# Patient Record
Sex: Female | Born: 1995 | Race: Black or African American | Hispanic: No | Marital: Single | Smoking: Never smoker
Health system: Southern US, Community
[De-identification: ages and names within clinical notes are randomized; demographics above are authoritative.]

## PROBLEM LIST (undated history)

## (undated) DIAGNOSIS — B20 Human immunodeficiency virus [HIV] disease: Secondary | ICD-10-CM

## (undated) DIAGNOSIS — A159 Respiratory tuberculosis unspecified: Secondary | ICD-10-CM

## (undated) DIAGNOSIS — Z21 Asymptomatic human immunodeficiency virus [HIV] infection status: Secondary | ICD-10-CM

## (undated) HISTORY — DX: Respiratory tuberculosis unspecified: A15.9

---
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
## 2015-02-04 ENCOUNTER — Ambulatory Visit (INDEPENDENT_AMBULATORY_CARE_PROVIDER_SITE_OTHER): Payer: Commercial Managed Care - HMO | Admitting: Internal Medicine

## 2015-02-04 ENCOUNTER — Encounter: Payer: Self-pay | Admitting: Internal Medicine

## 2015-02-04 VITALS — BP 106/66 | HR 74 | Temp 97.1°F | Wt 119.0 lb

## 2015-02-04 DIAGNOSIS — Z206 Contact with and (suspected) exposure to human immunodeficiency virus [HIV]: Secondary | ICD-10-CM

## 2015-02-04 DIAGNOSIS — Z23 Encounter for immunization: Secondary | ICD-10-CM | POA: Diagnosis not present

## 2015-02-04 DIAGNOSIS — B20 Human immunodeficiency virus [HIV] disease: Secondary | ICD-10-CM | POA: Diagnosis not present

## 2015-02-04 LAB — COMPLETE METABOLIC PANEL WITH GFR
ALT: 17 U/L (ref 5–32)
AST: 18 U/L (ref 12–32)
Albumin: 4.2 g/dL (ref 3.6–5.1)
Alkaline Phosphatase: 105 U/L (ref 47–176)
BUN: 14 mg/dL (ref 7–20)
CALCIUM: 9.3 mg/dL (ref 8.9–10.4)
CHLORIDE: 107 mmol/L (ref 98–110)
CO2: 22 mmol/L (ref 20–31)
Creat: 0.51 mg/dL (ref 0.50–1.00)
GFR, Est African American: >89 mL/min (ref >=60)
Glucose, Bld: 77 mg/dL (ref 65–99)
POTASSIUM: 3.9 mmol/L (ref 3.8–5.1)
Sodium: 136 mmol/L (ref 135–146)
Total Bilirubin: 0.8 mg/dL (ref 0.2–1.1)
Total Protein: 7.3 g/dL (ref 6.3–8.2)

## 2015-02-04 LAB — LIPID PANEL
CHOLESTEROL: 203 mg/dL — AB (ref 125–170)
HDL: 64 mg/dL (ref 36–76)
LDL Cholesterol: 125 mg/dL — ABNORMAL HIGH (ref <110)
Total CHOL/HDL Ratio: 3.2 Ratio (ref <=5.0)
Triglycerides: 71 mg/dL (ref 40–136)
VLDL: 14 mg/dL (ref <30)

## 2015-02-04 LAB — CBC WITH DIFFERENTIAL/PLATELET
Basophils Absolute: 0 10*3/uL (ref 0.0–0.1)
Basophils Relative: 0 % (ref 0–1)
EOS PCT: 1 % (ref 0–5)
Eosinophils Absolute: 0.1 10*3/uL (ref 0.0–0.7)
HEMATOCRIT: 38.5 % (ref 36.0–46.0)
Hemoglobin: 13 g/dL (ref 12.0–15.0)
LYMPHS ABS: 2.1 10*3/uL (ref 0.7–4.0)
LYMPHS PCT: 40 % (ref 12–46)
MCH: 30.6 pg (ref 26.0–34.0)
MCHC: 33.8 g/dL (ref 30.0–36.0)
MCV: 90.6 fL (ref 78.0–100.0)
MONO ABS: 0.4 10*3/uL (ref 0.1–1.0)
MONOS PCT: 8 % (ref 3–12)
MPV: 9.1 fL (ref 8.6–12.4)
Neutro Abs: 2.7 10*3/uL (ref 1.7–7.7)
Neutrophils Relative %: 51 % (ref 43–77)
Platelets: 246 10*3/uL (ref 150–400)
RBC: 4.25 MIL/uL (ref 3.87–5.11)
RDW: 13.7 % (ref 11.5–15.5)
WBC: 5.2 10*3/uL (ref 4.0–10.5)

## 2015-02-04 MED ORDER — EMTRICITABINE-TENOFOVIR DF 200-300 MG PO TABS: 1.0000 | ORAL_TABLET | Freq: Every day | ORAL | Status: DC

## 2015-02-04 MED ORDER — DOLUTEGRAVIR SODIUM 50 MG PO TABS: 50.0000 mg | ORAL_TABLET | Freq: Every day | ORAL | Status: DC

## 2015-02-04 NOTE — Progress Notes (Signed)
Patient ID: Anye Brose, female   DOB: 1996/03/27, 19 y.o.   MRN: [id number] HPI: Neelie Welshans is a 19 y.o. female who is here for her initial visit for her HIV.   Allergies: No Known Allergies  Vitals: Temp: 97.1 F (36.2 C) (10/06 0858) Temp Source: Oral (10/06 0858) BP: 106/66 mmHg (10/06 0858) Pulse Rate: 74 (10/06 0858)  Past Medical History: Past Medical History  Diagnosis Date  . Tuberculosis     Social History: Social History   Social History  . Marital Status: Single    Spouse Name: N/A  . Number of Children: N/A  . Years of Education: N/A   Social History Main Topics  . Smoking status: Never Smoker   . Smokeless tobacco: Never Used  . Alcohol Use: No  . Drug Use: None  . Sexual Activity: No   Other Topics Concern  . None   Social History Narrative    Previous Regimen: Kaletra + Combivir?  Current Regimen: KLT + TRV  Labs: No results found for: HIV1RNAQUANT, HIV1RNAVL, CD4TABS, HEPBSAB, HEPBSAG, HCVAB  CrCl: CrCl cannot be calculated (Unknown ideal weight.).  Lipids: No results found for: CHOL, TRIG, HDL, CHOLHDL, VLDL, LDLCALC  Assessment: She was a perinatally infected immigrant from [place]. She was managed by [facility] and has been on KLT/TRV. They did discuss some option of changing the regimen to single daily regimen of Stribild/Odefsey/Triumeq/Prez+Truv. We discussed the options with her and the adopted mom today. The mom stated that she doesn't eat a consistent meal daily. Therefore, several of the regimen is probably not the best options. Explained DTG + TRU to her since this would have much less side effects and better tolerability. Both the mom and pt agreed that this will be a better option since it can be taken without the meals restriction. This is the plan we are going to go with.   Recommendations:  DC KLT Start DTG  PO qday Cont Truvada 1 PO qday  Clide Cliff, PharmD Clinical Infectious Disease  Pharmacist [facility] for Infectious Disease 02/04/2015, 2:45 PM

## 2015-02-05 LAB — T-HELPER CELL (CD4) - (RCID CLINIC ONLY)
CD4 T CELL HELPER: 30 % — AB (ref 33–55)
CD4 T Cell Abs: 590 /uL (ref 400–2700)

## 2015-02-05 LAB — HIV-1 RNA QUANT-NO REFLEX-BLD

## 2015-02-11 LAB — HLA B*5701: HLA-B*5701 w/rflx HLA-B High: NEGATIVE

## 2015-02-26 ENCOUNTER — Other Ambulatory Visit: Payer: Self-pay | Admitting: *Deleted

## 2015-02-26 DIAGNOSIS — B20 Human immunodeficiency virus [HIV] disease: Secondary | ICD-10-CM

## 2015-02-26 DIAGNOSIS — Z206 Contact with and (suspected) exposure to human immunodeficiency virus [HIV]: Secondary | ICD-10-CM

## 2015-02-26 MED ORDER — EMTRICITABINE-TENOFOVIR DF 200-300 MG PO TABS: 1.0000 | ORAL_TABLET | Freq: Every day | ORAL | Status: DC

## 2015-02-26 MED ORDER — DOLUTEGRAVIR SODIUM 50 MG PO TABS: 50.0000 mg | ORAL_TABLET | Freq: Every day | ORAL | Status: DC

## 2015-03-02 ENCOUNTER — Other Ambulatory Visit: Payer: Self-pay | Admitting: *Deleted

## 2015-03-02 ENCOUNTER — Telehealth: Payer: Self-pay | Admitting: *Deleted

## 2015-03-02 DIAGNOSIS — B20 Human immunodeficiency virus [HIV] disease: Secondary | ICD-10-CM

## 2015-03-02 DIAGNOSIS — Z206 Contact with and (suspected) exposure to human immunodeficiency virus [HIV]: Secondary | ICD-10-CM

## 2015-03-02 MED ORDER — EMTRICITABINE-TENOFOVIR DF 200-300 MG PO TABS: 1.0000 | ORAL_TABLET | Freq: Every day | ORAL | Status: DC

## 2015-03-02 MED ORDER — DOLUTEGRAVIR SODIUM 50 MG PO TABS: 50.0000 mg | ORAL_TABLET | Freq: Every day | ORAL | Status: DC

## 2015-03-02 NOTE — Telephone Encounter (Signed)
Notified via fax from [employer] of new pharmacy requirement.  Sent prescriptions electronically to [facility] to update patient's file.  Patient has seen Ulyses SouthwardMinh Pham, pharmacist.  She is scheduled to follow up with labs on 11/15 and Dr. Drue SecondSnider on 12/6 for her first [facility] visit. Andree CossHowell, Wretha Laris M, RN

## 2015-03-08 NOTE — Progress Notes (Signed)
Patient ID: Katherine [name], female   DOB: [birth year], 19 y.o.   MRN: [id number]       Patient ID: Katherine [name], female   DOB: [birth year], 19 y.o.   MRN: [id number]  HPI 19yo F with perinatal HIV disease, from [place]. Adopted. Previously cared at [facility] in [place]. Mother reports that her records reveal she has been undergood virologic control at time of adoption and now has been cared for at [facility], where she has been on truvada/kaletra. She is a Holiday representative at [employer], has some development delay.  Soc hx: works part time along with going to school. Not sexually active  Outpatient Encounter Prescriptions as of 02/04/2015  Medication Sig  . [DISCONTINUED] emtricitabine-tenofovir (TRUVADA) 200-300 MG per tablet Take 1 tablet by mouth daily.  . [DISCONTINUED] emtricitabine-tenofovir (TRUVADA) 200-300 MG tablet Take 1 tablet by mouth daily.  . [DISCONTINUED] lopinavir-ritonavir (KALETRA) 200-50 MG per tablet Take 2 tablets by mouth 2 (two) times daily.  . [DISCONTINUED] dolutegravir (TIVICAY) 50 MG tablet Take 1 tablet (50 mg total) by mouth daily.   No facility-administered encounter medications on file as of 02/04/2015.     Patient Active Problem List   Diagnosis Date Noted  . 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 H/O myopia 12/02/2014  . Developmental delay 12/02/2014  . Perinatal HIV exposure 12/02/2014     Health Maintenance Due  Topic Date Due  . TETANUS/TDAP  03/02/2015     Review of Systems  Constitutional: Negative for fever, chills, diaphoresis, activity change, appetite change, fatigue and unexpected weight change.  HENT: Negative for congestion, sore throat, rhinorrhea, sneezing, trouble swallowing and sinus pressure.  Eyes: Negative for photophobia and visual disturbance.  Respiratory: Negative for cough, chest tightness, shortness of breath, wheezing and stridor.  Cardiovascular: Negative for chest pain, palpitations and leg swelling.  Gastrointestinal:  Negative for nausea, vomiting, abdominal pain, diarrhea, constipation, blood in stool, abdominal distention and anal bleeding.  Genitourinary: Negative for dysuria, hematuria, flank pain and difficulty urinating.  Musculoskeletal: Negative for myalgias, back pain, joint swelling, arthralgias and gait problem.  Skin: Negative for color change, pallor, rash and wound.  Neurological: Negative for dizziness, tremors, weakness and light-headedness.  Hematological: Negative for adenopathy. Does not bruise/bleed easily.  Psychiatric/Behavioral: Negative for behavioral problems, confusion, sleep disturbance, dysphoric mood, decreased concentration and agitation.    Physical Exam   BP 106/66 mmHg  Pulse 74  Temp(Src) 97.1 F (36.2 C) (Oral)  Wt 119 lb (53.978 kg)  LMP 01/18/2015 (Approximate) Physical Exam  Constitutional:  oriented to person, place, and time. appears well-developed and well-nourished. No distress.  HENT: [place]/AT, PERRLA, no scleral icterus Mouth/Throat: Oropharynx is clear and moist. No oropharyngeal exudate.  Cardiovascular: Normal rate, regular rhythm and normal heart sounds. Exam reveals no gallop and no friction rub.  No murmur heard.  Pulmonary/Chest: Effort normal and breath sounds normal. No respiratory distress.  has no wheezes.  Neck = supple, no nuchal rigidity Abdominal: Soft. Bowel sounds are normal.  exhibits no distension. There is no tenderness.  Lymphadenopathy: no cervical adenopathy. No axillary adenopathy Neurological: alert and oriented to person, place, and time.  Skin: Skin is warm and dry. No rash noted. No erythema.  Psychiatric: a normal mood and affect.  behavior is normal.   Lab Results  Component Value Date   CD4TCELL 30* 02/04/2015   Lab Results  Component Value Date   CD4TABS 590 02/04/2015   Lab Results  Component Value Date   HIV1RNAQUANT <20 02/04/2015  No results found for: HEPBSAB No results found for: RPR  CBC Lab Results    Component Value Date   WBC 5.2 02/04/2015   RBC 4.25 02/04/2015   HGB 13.0 02/04/2015   HCT 38.5 02/04/2015   PLT 246 02/04/2015   MCV 90.6 02/04/2015   MCH 30.6 02/04/2015   MCHC 33.8 02/04/2015   RDW 13.7 02/04/2015   LYMPHSABS 2.1 02/04/2015   MONOABS 0.4 02/04/2015   EOSABS 0.1 02/04/2015   BASOSABS 0.0 02/04/2015   BMET Lab Results  Component Value Date   NA 136 02/04/2015   K 3.9 02/04/2015   CL 107 02/04/2015   CO2 22 02/04/2015   GLUCOSE 77 02/04/2015   BUN 14 02/04/2015   CREATININE 0.51 02/04/2015   CALCIUM 9.3 02/04/2015   GFRNONAA >89 02/04/2015   GFRAA >89 02/04/2015     Assessment and Plan  hiv disease = will switch to tivicay/truvada. Discussed adherence counseling  Health maintenance = will give flu shot

## 2015-03-16 ENCOUNTER — Other Ambulatory Visit: Payer: Commercial Managed Care - HMO

## 2015-03-16 DIAGNOSIS — B20 Human immunodeficiency virus [HIV] disease: Secondary | ICD-10-CM

## 2015-03-17 LAB — T-HELPER CELL (CD4) - (RCID CLINIC ONLY)
CD4 T CELL ABS: 520 /uL (ref 400–2700)
CD4 T CELL HELPER: 33 % (ref 33–55)

## 2015-03-18 LAB — HIV-1 RNA QUANT-NO REFLEX-BLD
HIV 1 RNA Quant: <20 copies/mL (ref <20)
HIV-1 RNA Quant, Log: <1.3 Log copies/mL (ref <1.30)

## 2015-04-06 ENCOUNTER — Ambulatory Visit (INDEPENDENT_AMBULATORY_CARE_PROVIDER_SITE_OTHER): Payer: Commercial Managed Care - HMO | Admitting: Internal Medicine

## 2015-04-06 ENCOUNTER — Encounter: Payer: Self-pay | Admitting: Internal Medicine

## 2015-04-06 VITALS — BP 108/71 | HR 77 | Temp 98.6°F | Wt 122.0 lb

## 2015-04-06 DIAGNOSIS — N926 Irregular menstruation, unspecified: Secondary | ICD-10-CM

## 2015-04-06 DIAGNOSIS — B20 Human immunodeficiency virus [HIV] disease: Secondary | ICD-10-CM | POA: Diagnosis not present

## 2015-04-06 LAB — POCT URINE PREGNANCY: PREG TEST UR: NEGATIVE

## 2015-04-06 MED ORDER — EMTRICITABINE-TENOFOVIR AF 200-25 MG PO TABS: 1.0000 | ORAL_TABLET | Freq: Every day | ORAL | Status: DC

## 2015-04-06 NOTE — Progress Notes (Signed)
Patient ID: Katherine [name], female   DOB: [birth year], 19 y.o.   MRN: [id number]       Patient ID: Katherine [name], female   DOB: [birth year], 19 y.o.   MRN: [id number]  HPI Katherine [name] is a 19yo F, with perinatal HIV disease, hx of pulm tb, CD 4 count of 520/VL<20. She has learning disabilities. Currently a senior at [employer]. Working 2 jobs one at [employer] and at [employer].At last visit, we changed her to tivicay/truvada to see if she can tolerate the regimen. She may have irregular menstrual cycle. She is not sexually active yet. Not in a relationship. She reports her last menses in mid oct   Pmhx: She was adopted from [place] while parents worked in [place] doing missionary work? She was transferred for peds ID clinic at [facility].    Outpatient Encounter Prescriptions as of 04/06/2015  Medication Sig  . dolutegravir (TIVICAY) 50 MG tablet Take 1 tablet (50 mg total) by mouth daily.  Marland Kitchen emtricitabine-tenofovir (TRUVADA) 200-300 MG tablet Take 1 tablet by mouth daily.   No facility-administered encounter medications on file as of 04/06/2015.     Patient Active Problem List   Diagnosis Date Noted  . 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 H/O myopia 12/02/2014  . Developmental delay 12/02/2014  . Perinatal HIV exposure 12/02/2014     Health Maintenance Due  Topic Date Due  . TETANUS/TDAP  03/02/2015     Review of Systems Review of Systems  Constitutional: Negative for fever, chills, diaphoresis, activity change, appetite change, fatigue and unexpected weight change.  HENT: Negative for congestion, sore throat, rhinorrhea, sneezing, trouble swallowing and sinus pressure.  Eyes: Negative for photophobia and visual disturbance.  Respiratory: Negative for cough, chest tightness, shortness of breath, wheezing and stridor.  Cardiovascular: Negative for chest pain, palpitations and leg swelling.  Gastrointestinal: Negative for nausea, vomiting, abdominal pain, diarrhea,  constipation, blood in stool, abdominal distention and anal bleeding.  Genitourinary: Negative for dysuria, hematuria, flank pain and difficulty urinating.  Musculoskeletal: Negative for myalgias, back pain, joint swelling, arthralgias and gait problem.  Skin: Negative for color change, pallor, rash and wound.  Neurological: Negative for dizziness, tremors, weakness and light-headedness.  Hematological: Negative for adenopathy. Does not bruise/bleed easily.  Psychiatric/Behavioral: Negative for behavioral problems, confusion, sleep disturbance, dysphoric mood, decreased concentration and agitation.   Physical Exam   BP 108/71 mmHg  Pulse 77  Temp(Src) 98.6 F (37 C) (Oral)  Wt 122 lb (55.339 kg)  LMP 02/01/2015 Physical Exam  Constitutional:  oriented to person, place, and time. appears well-developed and well-nourished. No distress.  HENT: [place]/AT, PERRLA, no scleral icterus Mouth/Throat: Oropharynx is clear and moist. No oropharyngeal exudate. Wearing braces Cardiovascular: Normal rate, regular rhythm and normal heart sounds. Exam reveals no gallop and no friction rub.  No murmur heard.  Pulmonary/Chest: Effort normal and breath sounds normal. No respiratory distress.  has no wheezes.  Neck = supple, no nuchal rigidity Lymphadenopathy: no cervical adenopathy. No axillary adenopathy Neurological: alert and oriented to person, place, and time.  Skin: Skin is warm and dry. No rash noted. No erythema.  Psychiatric: a normal mood and affect.  behavior is normal.   Lab Results  Component Value Date   CD4TCELL 33 03/16/2015   Lab Results  Component Value Date   CD4TABS 520 03/16/2015   CD4TABS 590 02/04/2015   Lab Results  Component Value Date   HIV1RNAQUANT <20 03/16/2015   No results found for: HEPBSAB No results found  for: RPR  CBC Lab Results  Component Value Date   WBC 5.2 02/04/2015   RBC 4.25 02/04/2015   HGB 13.0 02/04/2015   HCT 38.5 02/04/2015   PLT 246  02/04/2015   MCV 90.6 02/04/2015   MCH 30.6 02/04/2015   MCHC 33.8 02/04/2015   RDW 13.7 02/04/2015   LYMPHSABS 2.1 02/04/2015   MONOABS 0.4 02/04/2015   EOSABS 0.1 02/04/2015   BASOSABS 0.0 02/04/2015   BMET Lab Results  Component Value Date   NA 136 02/04/2015   K 3.9 02/04/2015   CL 107 02/04/2015   CO2 22 02/04/2015   GLUCOSE 77 02/04/2015   BUN 14 02/04/2015   CREATININE 0.51 02/04/2015   CALCIUM 9.3 02/04/2015   GFRNONAA >89 02/04/2015   GFRAA >89 02/04/2015     Assessment and Plan  hiv disease = Will check viral load to see that she continues to be virally suppressed on new regimen. Will continue with tivicay nad change truvada to descovy (TAF based regimen). Gave discount card  Irregular menses = we will check Urine pregnancy test to ensure it is negative. Will have her track her menstrual cycle on a calendar to see that she is fairly regular. If not, may need further work up or use of OBP to synchronize her menstrual cycle  Hiv/sti prevention = counseled her briefly on importance of using condoms when she decides to become sexually active.   rtc in 3 months

## 2015-04-07 LAB — T-HELPER CELL (CD4) - (RCID CLINIC ONLY)
CD4 T CELL ABS: 620 /uL (ref 400–2700)
CD4 T CELL HELPER: 31 % — AB (ref 33–55)

## 2015-04-07 LAB — HIV-1 RNA QUANT-NO REFLEX-BLD
HIV 1 RNA Quant: <20 copies/mL (ref <20)
HIV-1 RNA Quant, Log: <1.3 Log copies/mL (ref <1.30)

## 2015-05-06 ENCOUNTER — Other Ambulatory Visit: Payer: Self-pay | Admitting: *Deleted

## 2015-05-06 DIAGNOSIS — B20 Human immunodeficiency virus [HIV] disease: Secondary | ICD-10-CM

## 2015-05-06 MED ORDER — EMTRICITABINE-TENOFOVIR AF 200-25 MG PO TABS: 1.0000 | ORAL_TABLET | Freq: Every day | ORAL | Status: DC

## 2015-05-06 MED ORDER — DOLUTEGRAVIR SODIUM 50 MG PO TABS
50.0000 mg | ORAL_TABLET | Freq: Every day | ORAL | Status: DC
Start: 2015-05-06 — End: 2015-06-17

## 2015-05-06 NOTE — Telephone Encounter (Signed)
Needing local refill, usually uses mail order.

## 2015-06-17 ENCOUNTER — Other Ambulatory Visit: Payer: Self-pay | Admitting: Internal Medicine

## 2015-06-17 DIAGNOSIS — B20 Human immunodeficiency virus [HIV] disease: Secondary | ICD-10-CM

## 2015-06-17 MED ORDER — EMTRICITABINE-TENOFOVIR AF 200-25 MG PO TABS: 1.0000 | ORAL_TABLET | Freq: Every day | ORAL | Status: DC

## 2015-07-06 ENCOUNTER — Ambulatory Visit (INDEPENDENT_AMBULATORY_CARE_PROVIDER_SITE_OTHER): Payer: Commercial Managed Care - PPO | Admitting: Internal Medicine

## 2015-07-06 ENCOUNTER — Encounter: Payer: Self-pay | Admitting: Internal Medicine

## 2015-07-06 VITALS — BP 112/68 | HR 80 | Temp 98.4°F | Wt 125.0 lb

## 2015-07-06 DIAGNOSIS — Z206 Contact with and (suspected) exposure to human immunodeficiency virus [HIV]: Secondary | ICD-10-CM | POA: Diagnosis not present

## 2015-07-06 DIAGNOSIS — B20 Human immunodeficiency virus [HIV] disease: Secondary | ICD-10-CM

## 2015-07-06 NOTE — Progress Notes (Signed)
Patient ID: Katherine [name], female   DOB: 1995-06-01, 20 y.o.   MRN: [id number]       Patient ID: Katherine [name], female   DOB: 10-20-1995, 20 y.o.   MRN: [id number]  HPI  19yo F ugandan who has perinatal HIV disease, adopted by  family, CD 4 count of 620/VL<20 (dec 2016)on tivicay and descovy. Doing well with adherence. No complaints with taking medicines. Denies missing doses. She is doing well in her final year of high school. She has 2 part-time jobs.  She follows with orthodontist for her corrective braces.   Soc hx: she denies being sexually active.   Outpatient Encounter Prescriptions as of 07/06/2015  Medication Sig  . emtricitabine-tenofovir AF (DESCOVY) 200-25 MG tablet Take 1 tablet by mouth daily.  Marland Kitchen TIVICAY 50 MG tablet TAKE 1 TABLET (50 MG TOTAL) BY MOUTH DAILY.   No facility-administered encounter medications on file as of 07/06/2015.     Patient Active Problem List   Diagnosis Date Noted  . 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 H/O myopia 12/02/2014  . Developmental delay 12/02/2014  . Perinatal HIV exposure 12/02/2014     Health Maintenance Due  Topic Date Due  . TETANUS/TDAP  03/02/2015     Review of Systems  Constitutional: Negative for fever, chills, diaphoresis, activity change, appetite change, fatigue and unexpected weight change.  HENT: Negative for congestion, sore throat, rhinorrhea, sneezing, trouble swallowing and sinus pressure.  Eyes: Negative for photophobia and visual disturbance.  Respiratory: Negative for cough, chest tightness, shortness of breath, wheezing and stridor.  Cardiovascular: Negative for chest pain, palpitations and leg swelling.  Gastrointestinal: Negative for nausea, vomiting, abdominal pain, diarrhea, constipation, blood in stool, abdominal distention and anal bleeding.  Genitourinary: Negative for dysuria, hematuria, flank pain and difficulty urinating.  Musculoskeletal: Negative for myalgias, back pain, joint swelling,  arthralgias and gait problem.  Skin: Negative for color change, pallor, rash and wound.  Neurological: Negative for dizziness, tremors, weakness and light-headedness.  Hematological: Negative for adenopathy. Does not bruise/bleed easily.  Psychiatric/Behavioral: Negative for behavioral problems, confusion, sleep disturbance, dysphoric mood, decreased concentration and agitation.    Physical Exam   BP 112/68 mmHg  Pulse 80  Temp(Src) 98.4 F (36.9 C) (Oral)  Wt 125 lb (56.7 kg)  LMP 06/17/2015 (Exact Date) Physical Exam  Constitutional:  oriented to person, place, and time. appears well-developed and well-nourished. No distress.  HENT: /AT, PERRLA, no scleral icterus Mouth/Throat: Oropharynx is clear and moist. No oropharyngeal exudate.  Cardiovascular: Normal rate, regular rhythm and normal heart sounds. Exam reveals no gallop and no friction rub.  No murmur heard.  Pulmonary/Chest: Effort normal and breath sounds normal. No respiratory distress.  has no wheezes.  Neck = supple, no nuchal rigidity Abdominal: Soft. Bowel sounds are normal.  exhibits no distension. There is no tenderness.  Lymphadenopathy: no cervical adenopathy. No axillary adenopathy Neurological: alert and oriented to person, place, and time.  Skin: Skin is warm and dry. No rash noted. No erythema.  Psychiatric: a normal mood and affect.  behavior is normal.   Lab Results  Component Value Date   CD4TCELL 31* 04/06/2015   Lab Results  Component Value Date   CD4TABS 620 04/06/2015   CD4TABS 520 03/16/2015   CD4TABS 590 02/04/2015   Lab Results  Component Value Date   HIV1RNAQUANT <20 04/06/2015   No results found for: HEPBSAB No results found for: RPR  CBC Lab Results  Component Value Date   WBC 5.2 02/04/2015  RBC 4.25 02/04/2015   HGB 13.0 02/04/2015   HCT 38.5 02/04/2015   PLT 246 02/04/2015   MCV 90.6 02/04/2015   MCH 30.6 02/04/2015   MCHC 33.8 02/04/2015   RDW 13.7 02/04/2015    LYMPHSABS 2.1 02/04/2015   MONOABS 0.4 02/04/2015   EOSABS 0.1 02/04/2015   BASOSABS 0.0 02/04/2015   BMET Lab Results  Component Value Date   NA 136 02/04/2015   K 3.9 02/04/2015   CL 107 02/04/2015   CO2 22 02/04/2015   GLUCOSE 77 02/04/2015   BUN 14 02/04/2015   CREATININE 0.51 02/04/2015   CALCIUM 9.3 02/04/2015   GFRNONAA >89 02/04/2015   GFRAA >89 02/04/2015     Assessment and Plan  hiv disease =well controlled. Will check her labs in June (6 month labs) continue on tivicay and descovy  Menses = regular. Currently no need to refer to ob/gyn. She is not sexually active presently. We have discussed importance of using condoms and discussed birth control pills to prevent unplanned pregnancy. Consider doing condom demonstrations at next visit  Health maintenance = uptodate on all vaccines. Will double check that she has had HPV vaccination

## 2015-09-23 ENCOUNTER — Other Ambulatory Visit: Payer: Commercial Managed Care - HMO

## 2015-10-07 ENCOUNTER — Encounter: Payer: Self-pay | Admitting: Internal Medicine

## 2015-10-07 ENCOUNTER — Ambulatory Visit (INDEPENDENT_AMBULATORY_CARE_PROVIDER_SITE_OTHER): Payer: Commercial Managed Care - PPO | Admitting: Internal Medicine

## 2015-10-07 VITALS — BP 101/65 | HR 71 | Temp 98.1°F | Ht 62.0 in | Wt 130.2 lb

## 2015-10-07 DIAGNOSIS — Z23 Encounter for immunization: Secondary | ICD-10-CM

## 2015-10-07 DIAGNOSIS — B20 Human immunodeficiency virus [HIV] disease: Secondary | ICD-10-CM | POA: Diagnosis not present

## 2015-10-07 NOTE — Progress Notes (Signed)
Patient ID: Katherine [name], female   DOB: 12/02/1995, 20 y.o.   MRN: [id number]       Patient ID: Katherine [name], female   DOB: 01/22/1996, 20 y.o.   MRN: [id number]  HPI  20yo F with perinatally acquired hiv disease, CD 4 count of 620/VL<20 on tivicay-descovy. Doing well with adherence. She has 2 part-time jobs presently .will be graduating from high school this week. She denies any difficulties with her health. Not sexually active. Outpatient Encounter Prescriptions as of 10/07/2015  Medication Sig  . emtricitabine-tenofovir AF (DESCOVY) 200-25 MG tablet Take 1 tablet by mouth daily.  Marland Kitchen. TIVICAY 50 MG tablet TAKE 1 TABLET (50 MG TOTAL) BY MOUTH DAILY.   No facility-administered encounter medications on file as of 10/07/2015.     Patient Active Problem List   Diagnosis Date Noted  . 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 H/O myopia 12/02/2014  . Developmental delay 12/02/2014  . Perinatal HIV exposure 12/02/2014     Health Maintenance Due  Topic Date Due  . TETANUS/TDAP  03/02/2015     Review of Systems   Constitutional: Negative for fever, chills, diaphoresis, activity change, appetite change, fatigue and unexpected weight change.  HENT: Negative for congestion, sore throat, rhinorrhea, sneezing, trouble swallowing and sinus pressure.  Eyes: Negative for photophobia and visual disturbance.  Respiratory: Negative for cough, chest tightness, shortness of breath, wheezing and stridor.  Cardiovascular: Negative for chest pain, palpitations and leg swelling.  Gastrointestinal: Negative for nausea, vomiting, abdominal pain, diarrhea, constipation, blood in stool, abdominal distention and anal bleeding.  Genitourinary: Negative for dysuria, hematuria, flank pain and difficulty urinating.  Musculoskeletal: Negative for myalgias, back pain, joint swelling, arthralgias and gait problem.  Skin: Negative for color change, pallor, rash and wound.  Neurological: Negative for dizziness,  tremors, weakness and light-headedness.  Hematological: Negative for adenopathy. Does not bruise/bleed easily.  Psychiatric/Behavioral: Negative for behavioral problems, confusion, sleep disturbance, dysphoric mood, decreased concentration and agitation.    Physical Exam   BP 101/65 mmHg  Pulse 71  Temp(Src) 98.1 F (36.7 C) (Oral)  Ht 5\' 2"  (1.575 m)  Wt 130 lb 4 oz (59.081 kg)  BMI 23.82 kg/m2  LMP 09/27/2015 (Exact Date) Physical Exam  Constitutional:  oriented to person, place, and time. appears well-developed and well-nourished. No distress.  HENT: [place]/AT, PERRLA, no scleral icterus Mouth/Throat: Oropharynx is clear and moist. No oropharyngeal exudate.  Cardiovascular: Normal rate, regular rhythm and normal heart sounds. Exam reveals no gallop and no friction rub.  No murmur heard.  Pulmonary/Chest: Effort normal and breath sounds normal. No respiratory distress.  has no wheezes.  Neck = supple, no nuchal rigidity Abdominal: Soft. Bowel sounds are normal.  exhibits no distension. There is no tenderness.  Lymphadenopathy: no cervical adenopathy. No axillary adenopathy Neurological: alert and oriented to person, place, and time.  Skin: Skin is warm and dry. No rash noted. No erythema.  Psychiatric: a normal mood and affect.  behavior is normal.   Lab Results  Component Value Date   CD4TCELL 31* 04/06/2015   Lab Results  Component Value Date   CD4TABS 620 04/06/2015   CD4TABS 520 03/16/2015   CD4TABS 590 02/04/2015   Lab Results  Component Value Date   HIV1RNAQUANT <20 04/06/2015   No results found for: HEPBSAB No results found for: RPR  CBC Lab Results  Component Value Date   WBC 5.2 02/04/2015   RBC 4.25 02/04/2015   HGB 13.0 02/04/2015   HCT 38.5 02/04/2015  PLT 246 02/04/2015   MCV 90.6 02/04/2015   MCH 30.6 02/04/2015   MCHC 33.8 02/04/2015   RDW 13.7 02/04/2015   LYMPHSABS 2.1 02/04/2015   MONOABS 0.4 02/04/2015   EOSABS 0.1 02/04/2015   BASOSABS  0.0 02/04/2015   BMET Lab Results  Component Value Date   NA 136 02/04/2015   K 3.9 02/04/2015   CL 107 02/04/2015   CO2 22 02/04/2015   GLUCOSE 77 02/04/2015   BUN 14 02/04/2015   CREATININE 0.51 02/04/2015   CALCIUM 9.3 02/04/2015   GFRNONAA >89 02/04/2015   GFRAA >89 02/04/2015     Assessment and Plan  hiv disease = will continue with current regimen. Will get 6 month labs today  rtc in Fall   imms = will give 3rd dose of HPV

## 2015-10-08 LAB — HIV-1 RNA QUANT-NO REFLEX-BLD
HIV 1 RNA Quant: <20 copies/mL (ref <20)
HIV-1 RNA Quant, Log: <1.3 Log copies/mL (ref <1.30)

## 2015-10-08 LAB — T-HELPER CELL (CD4) - (RCID CLINIC ONLY)
CD4 % Helper T Cell: 28 % — ABNORMAL LOW (ref 33–55)
CD4 T CELL ABS: 600 /uL (ref 400–2700)

## 2016-02-07 ENCOUNTER — Ambulatory Visit: Payer: Commercial Managed Care - PPO | Admitting: Internal Medicine

## 2016-03-30 ENCOUNTER — Other Ambulatory Visit: Payer: Self-pay | Admitting: Internal Medicine

## 2016-03-30 DIAGNOSIS — B20 Human immunodeficiency virus [HIV] disease: Secondary | ICD-10-CM

## 2016-04-07 ENCOUNTER — Other Ambulatory Visit: Payer: Self-pay | Admitting: Internal Medicine

## 2016-04-07 ENCOUNTER — Other Ambulatory Visit: Payer: Commercial Managed Care - PPO

## 2016-04-07 ENCOUNTER — Other Ambulatory Visit (HOSPITAL_COMMUNITY)
Admission: RE | Admit: 2016-04-07 | Discharge: 2016-04-07 | Disposition: A | Discharge status: Home / Self Care | Payer: Commercial Managed Care - PPO | Source: Ambulatory Visit | Attending: Internal Medicine | Admitting: Internal Medicine

## 2016-04-07 DIAGNOSIS — B20 Human immunodeficiency virus [HIV] disease: Secondary | ICD-10-CM

## 2016-04-07 DIAGNOSIS — Z113 Encounter for screening for infections with a predominantly sexual mode of transmission: Secondary | ICD-10-CM | POA: Diagnosis not present

## 2016-04-07 DIAGNOSIS — Z79899 Other long term (current) drug therapy: Secondary | ICD-10-CM

## 2016-04-07 LAB — COMPREHENSIVE METABOLIC PANEL
ALBUMIN: 4.1 g/dL (ref 3.6–5.1)
ALT: 28 U/L (ref 6–29)
AST: 27 U/L (ref 10–30)
Alkaline Phosphatase: 79 U/L (ref 33–115)
BUN: 9 mg/dL (ref 7–25)
CALCIUM: 9.1 mg/dL (ref 8.6–10.2)
CHLORIDE: 107 mmol/L (ref 98–110)
CO2: 22 mmol/L (ref 20–31)
CREATININE: 0.53 mg/dL (ref 0.50–1.10)
Glucose, Bld: 81 mg/dL (ref 65–99)
POTASSIUM: 3.7 mmol/L (ref 3.5–5.3)
Sodium: 138 mmol/L (ref 135–146)
TOTAL PROTEIN: 7.8 g/dL (ref 6.1–8.1)
Total Bilirubin: 0.5 mg/dL (ref 0.2–1.2)

## 2016-04-07 LAB — CBC
HEMATOCRIT: 39.7 % (ref 35.0–45.0)
HEMOGLOBIN: 12.6 g/dL (ref 11.7–15.5)
MCH: 28.4 pg (ref 27.0–33.0)
MCHC: 31.7 g/dL — ABNORMAL LOW (ref 32.0–36.0)
MCV: 89.6 fL (ref 80.0–100.0)
MPV: 9.5 fL (ref 7.5–12.5)
Platelets: 228 10*3/uL (ref 140–400)
RBC: 4.43 MIL/uL (ref 3.80–5.10)
RDW: 13.3 % (ref 11.0–15.0)
WBC: 5 10*3/uL (ref 3.8–10.8)

## 2016-04-07 LAB — LIPID PANEL
CHOLESTEROL: 179 mg/dL (ref <200)
HDL: 49 mg/dL — ABNORMAL LOW (ref >50)
LDL CALC: 111 mg/dL — AB (ref <100)
TRIGLYCERIDES: 93 mg/dL (ref <150)
Total CHOL/HDL Ratio: 3.7 Ratio (ref <5.0)
VLDL: 19 mg/dL (ref <30)

## 2016-04-07 LAB — T-HELPER CELL (CD4) - (RCID CLINIC ONLY)
CD4 T CELL ABS: 360 /uL — AB (ref 400–2700)
CD4 T CELL HELPER: 15 % — AB (ref 33–55)

## 2016-04-08 LAB — RPR

## 2016-04-10 LAB — URINE CYTOLOGY ANCILLARY ONLY
Chlamydia: NEGATIVE
Neisseria Gonorrhea: NEGATIVE

## 2016-04-12 LAB — HIV-1 RNA QUANT-NO REFLEX-BLD
HIV 1 RNA Quant: 1133 copies/mL — ABNORMAL HIGH (ref <20)
HIV-1 RNA QUANT, LOG: 3.05 {Log_copies}/mL — AB (ref <1.30)

## 2016-04-20 ENCOUNTER — Encounter: Payer: Self-pay | Admitting: Internal Medicine

## 2016-04-20 ENCOUNTER — Ambulatory Visit (INDEPENDENT_AMBULATORY_CARE_PROVIDER_SITE_OTHER): Payer: Commercial Managed Care - PPO | Admitting: Internal Medicine

## 2016-04-20 VITALS — BP 99/69 | HR 87 | Temp 98.1°F | Wt 140.0 lb

## 2016-04-20 DIAGNOSIS — B20 Human immunodeficiency virus [HIV] disease: Secondary | ICD-10-CM

## 2016-04-20 DIAGNOSIS — Z206 Contact with and (suspected) exposure to human immunodeficiency virus [HIV]: Secondary | ICD-10-CM | POA: Diagnosis not present

## 2016-04-20 DIAGNOSIS — Z9119 Patient's noncompliance with other medical treatment and regimen: Secondary | ICD-10-CM

## 2016-04-20 DIAGNOSIS — Z91199 Patient's noncompliance with other medical treatment and regimen due to unspecified reason: Secondary | ICD-10-CM

## 2016-04-20 NOTE — Patient Instructions (Signed)
Come by in the clinic in beginning of January for labs

## 2016-04-20 NOTE — Progress Notes (Signed)
Patient ID: Katherine [name], female   DOB: 08/20/1995, 20 y.o.   MRN: [id number]  HPI Katherine [name] is a 20yo F with perinatally acquired hiv disease, with cognitive delay, Cd 4 count of 360/VL 1133 in dec 2017, she reports stopping her ART during the summer, due to fatigue of taking meds long term. She is here with her mother. Katherine [name] appearance is much different than at her previous appointments, she has darkened eyebrows, eyeliner, black lipstick-somewhat gothic appearing. Since we last saw her, she had just graduated high school. She previously was working 2 jobs with plans to go to [employer]. Her mother confides that her daughter was fired from a job since she was caught stealing. Her mother also mentioned that she suspects that Katherine [name] is trying to fit in, be a regular 20 year old. Katherine [name] has made a few poor choices over the summer where trust issues have now developed between parents and Katherine [name]. One of Katherine [name]'s friends, lives fairly unintended with older brothers and friends, which Katherine [name]'s mom does not know well and is concern for Katherine [name]'s personal safety thus she is forbidden to stay at her friend's house without parental supervision.   The strain appears to be that though Katherine [name] is [age]yr she is cognitively much younger, often too trusting. Family is concern that her recent change in her appearance also makes her more like an adult. Concern for unwanted female attention/getting taken advantage of.   Katherine [name] states that she has a female friend but not sexually active  She started back on taking her medications, now daily for the last 2 wks. Her mother is dispensing meds for her again (previously she trusted Katherine [name] to take her own meds)  backstory - Katherine [name] adopted around age 146-9 yo [place]. Her parents had lived in [place] for a few years during adoption process. Her mother reports that back at the orphanage, Katherine [name] had seen older kids died of advanced hiv disease due to lack of access to meds, so she is quite  surprised that Katherine [name] had willingly stopped to taking her meds  Outpatient Encounter Prescriptions as of 04/20/2016  Medication Sig  . DESCOVY 200-25 MG tablet TAKE 1 TABLET BY MOUTH DAILY.  Marland Kitchen. TIVICAY 50 MG tablet TAKE 1 TABLET (50 MG TOTAL) BY MOUTH DAILY.   No facility-administered encounter medications on file as of 04/20/2016.      Patient Active Problem List   Diagnosis Date Noted  . 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 H/O myopia 12/02/2014  . Developmental delay 12/02/2014  . Perinatal HIV exposure 12/02/2014     Health Maintenance Due  Topic Date Due  . INFLUENZA VACCINE  11/30/2015     Review of Systems  Constitutional: Negative for fever, chills, diaphoresis, activity change, appetite change, fatigue and unexpected weight change.  HENT: Negative for congestion, sore throat, rhinorrhea, sneezing, trouble swallowing and sinus pressure.  Eyes: Negative for photophobia and visual disturbance.  Respiratory: Negative for cough, chest tightness, shortness of breath, wheezing and stridor.  Cardiovascular: Negative for chest pain, palpitations and leg swelling.  Gastrointestinal: Negative for nausea, vomiting, abdominal pain, diarrhea, constipation, blood in stool, abdominal distention and anal bleeding.  Genitourinary: Negative for dysuria, hematuria, flank pain and difficulty urinating.  Musculoskeletal: Negative for myalgias, back pain, joint swelling, arthralgias and gait problem.  Skin: Negative for color change, pallor, rash and wound.  Neurological: Negative for dizziness, tremors, weakness and light-headedness.  Hematological: Negative for adenopathy. Does not bruise/bleed easily.  Psychiatric/Behavioral: Negative for behavioral problems,  confusion, sleep disturbance, dysphoric mood, decreased concentration and agitation.   BP 99/69   Pulse 87   Temp 98.1 F (36.7 C) (Oral)   Wt 140 lb (63.5 kg)   LMP 04/06/2016   BMI 25.61 kg/m  Physical Exam  Constitutional:   oriented to person, place, and time. appears well-developed and well-nourished, with heavy make-up, which is new. No distress.  HENT: [place]/AT, PERRLA, no scleral icterus Mouth/Throat: Oropharynx is clear and moist. No oropharyngeal exudate.  Cardiovascular: Normal rate, regular rhythm and normal heart sounds. Exam reveals no gallop and no friction rub.  No murmur heard.  Pulmonary/Chest: Effort normal and breath sounds normal. No respiratory distress.  has no wheezes.  Neck = supple, no nuchal rigidity Abdominal: Soft. Bowel sounds are normal.  exhibits no distension. There is no tenderness.  Lymphadenopathy: no cervical adenopathy. No axillary adenopathy Neurological: alert and oriented to person, place, and time.  Skin: Skin is warm and dry. No rash noted. No erythema.  Psychiatric: a normal mood and affect.  behavior is normal. Occasionally defensive during conversation with her mother    Lab Results  Component Value Date   CD4TCELL 15 (L) 04/07/2016   Lab Results  Component Value Date   CD4TABS 360 (L) 04/07/2016   CD4TABS 600 10/07/2015   CD4TABS 620 04/06/2015   Lab Results  Component Value Date   HIV1RNAQUANT 1,133 (H) 04/07/2016   No results found for: HEPBSAB No results found for: RPR  CBC Lab Results  Component Value Date   WBC 5.0 04/07/2016   RBC 4.43 04/07/2016   HGB 12.6 04/07/2016   HCT 39.7 04/07/2016   PLT 228 04/07/2016   MCV 89.6 04/07/2016   MCH 28.4 04/07/2016   MCHC 31.7 (L) 04/07/2016   RDW 13.3 04/07/2016   LYMPHSABS 2.1 02/04/2015   MONOABS 0.4 02/04/2015   EOSABS 0.1 02/04/2015   BASOSABS 0.0 02/04/2015   BMET Lab Results  Component Value Date   NA 138 04/07/2016   K 3.7 04/07/2016   CL 107 04/07/2016   CO2 22 04/07/2016   GLUCOSE 81 04/07/2016   BUN 9 04/07/2016   CREATININE 0.53 04/07/2016   CALCIUM 9.1 04/07/2016   GFRNONAA >89 02/04/2015   GFRAA >89 02/04/2015     Assessment and Plan  hiv disease = recently poorly  controlled due adherence issues. We will Add genotype. She has restarted her old regimen. We will plan to have her come back in early January so that we can check her VL at 4-6 wk mark. May need to change regimen if not undetectable.  Have her come back in 6 wk but labs at beginning of the year  cassie will do adherence appt in early january  Have her meet with thp, to see if interested in being with case manager as well as possibly joining support group.  Spent 40 min with patient with greater than 50% in adherence counseling

## 2016-04-28 ENCOUNTER — Telehealth: Payer: Self-pay | Admitting: *Deleted

## 2016-04-28 NOTE — Telephone Encounter (Signed)
Regarding the viral load drawn 12/8 that was supposed to be sent for genotype, Emma sent the email requesting this lab on 12/21 - they did not reply until 12/27.  The specimen is still frozen and was sent for further analysis on 12/28. It is still able to be processed. Andree CossHowell, Danni Shima M

## 2016-05-03 ENCOUNTER — Telehealth: Payer: Self-pay | Admitting: Pharmacist Clinician (PhC)/ Clinical Pharmacy Specialist

## 2016-05-03 ENCOUNTER — Ambulatory Visit: Payer: Commercial Managed Care - PPO

## 2016-05-03 NOTE — Telephone Encounter (Signed)
Talked to Ambrie's mom today after she couldn't come to the appt this am due to starting a new "class". Rescheduled for tomorrow at 4.

## 2016-05-04 ENCOUNTER — Ambulatory Visit (INDEPENDENT_AMBULATORY_CARE_PROVIDER_SITE_OTHER): Payer: Commercial Managed Care - PPO | Admitting: Pharmacist Clinician (PhC)/ Clinical Pharmacy Specialist

## 2016-05-04 DIAGNOSIS — B20 Human immunodeficiency virus [HIV] disease: Secondary | ICD-10-CM

## 2016-05-04 NOTE — Progress Notes (Signed)
HPI: Fritzi Mandesatricia Mccollom is a 21 y.o. female who is here to visit with pharmacy to help with resistance and check her VL.   Allergies: No Known Allergies  Vitals:    Past Medical History: Past Medical History:  Diagnosis Date  . Tuberculosis     Social History: Social History   Social History  . Marital status: Single    Spouse name: N/A  . Number of children: N/A  . Years of education: N/A   Social History Main Topics  . Smoking status: Never Smoker  . Smokeless tobacco: Never Used  . Alcohol use No  . Drug use: Unknown  . Sexual activity: No   Other Topics Concern  . Not on file   Social History Narrative  . No narrative on file    Previous Regimen: KLT, TRV  Current Regimen: DTG/Descovy  Labs: HIV 1 RNA Quant (copies/mL)  Date Value  04/07/2016 1,133 (H)  10/07/2015 <20  04/06/2015 <20   CD4 T Cell Abs (/uL)  Date Value  04/07/2016 360 (L)  10/07/2015 600  04/06/2015 620    CrCl: CrCl cannot be calculated (Patient's most recent lab result is older than the maximum 21 days allowed.).  Lipids:    Component Value Date/Time   CHOL 179 04/07/2016 1038   TRIG 93 04/07/2016 1038   HDL 49 (L) 04/07/2016 1038   CHOLHDL 3.7 04/07/2016 1038   VLDL 19 04/07/2016 1038   LDLCALC 111 (H) 04/07/2016 1038    Assessment: Elease Hashimotoatricia is here with mom for a pharmacy visit so we can check on her adherence. She missed the appt yesterday because of the nails class issue. 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 Her mom has been helping her to stay on track. Since the lapse in taking her meds, she has not missed any doses since then. I checked with Clydie BraunKaren yesterday and the genotype is still pending. I'm hoping that she has not developed any resistance. Going to get a VL today to see how she is doing. I'm going to bring her back in a month to check her compliance.   Once again, I explained to her about the importance of taking meds when it comes to HIV and the risk  that she would face if her immune declines. I think she seems to be a bit more motivated now.   Recommendations:  Cont DTG/Descovy HIV VL today F/u with pharmacy in 1 month  Ulyses SouthwardMinh Pham, PharmD, BCPS, AAHIVP, CPP Clinical Infectious Disease Pharmacist [facility] 05/04/2016, 10:19 PM

## 2016-05-04 NOTE — Patient Instructions (Signed)
Viral load today

## 2016-05-08 LAB — HIV-1 RNA QUANT-NO REFLEX-BLD
HIV 1 RNA Quant: 39 copies/mL — ABNORMAL HIGH (ref <20)
HIV-1 RNA Quant, Log: 1.59 Log copies/mL — ABNORMAL HIGH (ref <1.30)

## 2016-05-10 LAB — HIV-1 GENOTYPR PLUS

## 2016-05-22 ENCOUNTER — Telehealth: Payer: Self-pay

## 2016-05-22 NOTE — Telephone Encounter (Signed)
Romualdo BolkAlbert Sanders from [facility] called inquiring about when and if Pt was being seen here and I told him yes. Inquired about where Pt had been seen prior and I informed him that [facility].

## 2016-05-23 ENCOUNTER — Telehealth: Payer: Self-pay | Admitting: Pharmacist Clinician (PhC)/ Clinical Pharmacy Specialist

## 2016-05-23 ENCOUNTER — Other Ambulatory Visit: Payer: Self-pay | Admitting: Pharmacist Clinician (PhC)/ Clinical Pharmacy Specialist

## 2016-05-23 DIAGNOSIS — B20 Human immunodeficiency virus [HIV] disease: Secondary | ICD-10-CM

## 2016-05-23 MED ORDER — DARUNAVIR-COBICISTAT 800-150 MG PO TABS: 1.0000 | ORAL_TABLET | Freq: Every day | ORAL | 11 refills | Status: DC

## 2016-05-23 MED ORDER — DOLUTEGRAVIR SODIUM 50 MG PO TABS: ORAL_TABLET | ORAL | 11 refills | Status: DC

## 2016-05-23 MED ORDER — EMTRICITABINE-TENOFOVIR AF 200-25 MG PO TABS: 1.0000 | ORAL_TABLET | Freq: Every day | ORAL | 11 refills | Status: DC

## 2016-05-23 NOTE — Telephone Encounter (Signed)
Unfortunately, her insurance will not allowed [facility] to do the fill so Katherine [name] will call the insurance to see if they will override it.

## 2016-05-23 NOTE — Telephone Encounter (Signed)
Left a VM for Katherine [name]'s mother to call back to we can change her ART due to resistance.   Addendum:  Spoke to the mom today and explained to her that we need to add a third agent to her regimen. [facility]. The mom was very appreciative. Bringing her back in 2 weeks to check her compliance.   HIV Genotype Composite Data Genotype Dates:   Mutations in Bold impact drug susceptibility RT Mutations M184V, L210W, T215Y, K219N, L100I, K103N  PI Mutations   Integrase Mutations    Interpretation of Genotype Data per Stanford HIV Database Nucleoside RTIs  abacavir (ABC) Intermediate Resistance zidovudine (AZT) High-Level Resistance emtricitabine (FTC) High-Level Resistance lamivudine (3TC) High-Level Resistance tenofovir (TDF) Low-Level Resistance   Non-Nucleoside RTIs  efavirenz (EFV) High-Level Resistance etravirine (ETR) Intermediate Resistance nevirapine (NVP) High-Level Resistance rilpivirine (RPV) High-Level Resistance   Protease Inhibitors     Integrase Inhibitors

## 2016-05-24 ENCOUNTER — Telehealth: Payer: Self-pay | Admitting: Pharmacist Clinician (PhC)/ Clinical Pharmacy Specialist

## 2016-05-24 MED FILL — TIVICAY 50 MG TABLET: 50 | 30 days supply | Qty: 30 | Fill #0

## 2016-05-24 MED FILL — DESCOVY 200-25 MG TABS: 200-25 | 30 days supply | Qty: 30 | Fill #0

## 2016-05-24 NOTE — Telephone Encounter (Signed)
Called Betsy back to see if she was able to get her insurance to override the network pharmacy issue so it can be filled locally.

## 2016-05-25 ENCOUNTER — Telehealth: Payer: Self-pay | Admitting: Pharmacist Clinician (PhC)/ Clinical Pharmacy Specialist

## 2016-05-25 MED FILL — PREZCOBIX 800 MG-150 MG TAB: 800-150 | 30 days supply | Qty: 30 | Fill #0

## 2016-05-25 NOTE — Telephone Encounter (Signed)
Finally able to solve the copay issue for the Prezcobix. [facility]. Left a VM for Betsy.

## 2016-06-07 ENCOUNTER — Ambulatory Visit: Payer: Commercial Managed Care - PPO

## 2016-06-08 ENCOUNTER — Ambulatory Visit (INDEPENDENT_AMBULATORY_CARE_PROVIDER_SITE_OTHER): Payer: Commercial Managed Care - PPO | Admitting: Pharmacist Clinician (PhC)/ Clinical Pharmacy Specialist

## 2016-06-08 DIAGNOSIS — B20 Human immunodeficiency virus [HIV] disease: Secondary | ICD-10-CM

## 2016-06-08 NOTE — Patient Instructions (Signed)
Continue to take you your Tivicay, Descovy, and Prezcobix We will do labs

## 2016-06-08 NOTE — Progress Notes (Signed)
HPI: Katherine [name] is a 21 y.o. female who is here with her mom for a pharmacy visit.   Allergies: No Known Allergies  Vitals:    Past Medical History: Past Medical History:  Diagnosis Date  . Tuberculosis     Social History: Social History   Social History  . Marital status: Single    Spouse name: N/A  . Number of children: N/A  . Years of education: N/A   Social History Main Topics  . Smoking status: Never Smoker  . Smokeless tobacco: Never Used  . Alcohol use No  . Drug use: Unknown  . Sexual activity: No   Other Topics Concern  . Not on file   Social History Narrative  . No narrative on file    Previous Regimen: KLT/TRV  Current Regimen: DTG/Prez/Desc  Labs: HIV 1 RNA Quant (copies/mL)  Date Value  05/04/2016 39 (H)  04/07/2016 1,133 (H)  10/07/2015 <20   CD4 T Cell Abs (/uL)  Date Value  04/07/2016 360 (L)  10/07/2015 600  04/06/2015 620    CrCl: CrCl cannot be calculated (Patient's most recent lab result is older than the maximum 21 days allowed.).  Lipids:    Component Value Date/Time   CHOL 179 04/07/2016 1038   TRIG 93 04/07/2016 1038   HDL 49 (L) 04/07/2016 1038   CHOLHDL 3.7 04/07/2016 1038   VLDL 19 04/07/2016 1038   LDLCALC 111 (H) 04/07/2016 1038   HIV Genotype Composite Data Genotype Dates:   Mutations in [place] drug susceptibility RT Mutations M184V, L210W, T215Y, K219N, L100I, K103N  PI Mutations   Integrase Mutations    Interpretation of Genotype Data per Stanford HIV Database Nucleoside RTIs  abacavir (ABC) Intermediate Resistance zidovudine (AZT) High-Level Resistance emtricitabine (FTC) High-Level Resistance lamivudine (3TC) High-Level Resistance tenofovir (TDF) Low-Level Resistance   Non-Nucleoside RTIs  efavirenz (EFV) High-Level Resistance etravirine (ETR) Intermediate Resistance nevirapine (NVP) High-Level Resistance rilpivirine (RPV) High-Level Resistance   Protease Inhibitors      Integrase Inhibitors     Assessment: Katherine [name] is here for a follow up visit with us after adding prezcrobix due to her resistant. She has acquired a bit due to being perinatally transmitted. 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 She has had no trouble tolerating prezcobix. Told her that soon we will be able to de-escalate down to two tablet with Biktarvy + Prezcobix. I thought both were happy to her about that. I'm going to repeat her blood work today to see how she is doing with her VL and schedule her for a f/u with Dr. Drue SecondSnider soon. [facility] and all of the co-pays are being taken care of.   Recommendations:  Cont Tivicay 50mg  PO qday Cont Descovy 1 PO qday Cont Prezcobix 1 PO qday HIV VL, CD4 today F/u with Dr Drue SecondSnider in March  [name], PharmD, BCPS, AAHIVP, CPP Clinical Infectious Disease Pharmacist [facility] 06/08/2016, 11:05 PM

## 2016-06-09 LAB — T-HELPER CELL (CD4) - (RCID CLINIC ONLY)
CD4 % Helper T Cell: 20 % — ABNORMAL LOW (ref 33–55)
CD4 T Cell Abs: 450 /uL (ref 400–2700)

## 2016-06-13 LAB — HIV-1 RNA QUANT-NO REFLEX-BLD
HIV 1 RNA Quant: 23 copies/mL — ABNORMAL HIGH
HIV-1 RNA QUANT, LOG: 1.36 {Log_copies}/mL — AB

## 2016-06-21 MED FILL — TIVICAY 50 MG TABLET: 50 | 30 days supply | Qty: 30 | Fill #1

## 2016-06-21 MED FILL — PREZCOBIX 800 MG-150 MG TAB: 800-150 | 30 days supply | Qty: 30 | Fill #1

## 2016-06-21 MED FILL — DESCOVY 200-25 MG TABS: 200-25 | 30 days supply | Qty: 30 | Fill #1

## 2016-06-22 ENCOUNTER — Ambulatory Visit: Payer: Commercial Managed Care - PPO

## 2016-07-13 ENCOUNTER — Encounter: Payer: Self-pay | Admitting: Internal Medicine

## 2016-07-13 ENCOUNTER — Ambulatory Visit (INDEPENDENT_AMBULATORY_CARE_PROVIDER_SITE_OTHER): Payer: Commercial Managed Care - PPO | Admitting: Internal Medicine

## 2016-07-13 VITALS — BP 99/65 | HR 75 | Temp 98.3°F | Wt 140.0 lb

## 2016-07-13 DIAGNOSIS — Z23 Encounter for immunization: Secondary | ICD-10-CM

## 2016-07-13 DIAGNOSIS — B20 Human immunodeficiency virus [HIV] disease: Secondary | ICD-10-CM | POA: Diagnosis not present

## 2016-07-13 NOTE — Progress Notes (Signed)
RFV: hiv disease  Patient ID: Katherine [name], female   DOB: 10/05/1995, 21 y.o.   MRN: [id number]  HPI  Katherine Reichmann21yo F with perinatally acquired HIV disease, Cd 4 count of 450/VL 23 in FEb 2018. She is on tivicay-DRVc-descovy. Doing much better with adherence. 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 She had a period in the winter 2017, with pill fatigue and stopped taking her meds temporarily, but now back to understanding importance of adherence  I have reviewed her records in [facility] Outpatient Encounter Prescriptions as of 07/13/2016  Medication Sig  . darunavir-cobicistat (PREZCOBIX) 800-150 MG tablet Take 1 tablet by mouth daily with breakfast. Swallow whole. Do NOT crush, break or chew tablets. Take with food.  . dolutegravir (TIVICAY) 50 MG tablet TAKE 1 TABLET (50 MG TOTAL) BY MOUTH DAILY.  Marland Kitchen. emtricitabine-tenofovir AF (DESCOVY) 200-25 MG tablet Take 1 tablet by mouth daily.   No facility-administered encounter medications on file as of 07/13/2016.      Patient Active Problem List   Diagnosis Date Noted  . 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 H/O myopia 12/02/2014  . Developmental delay 12/02/2014  . Perinatal HIV exposure 12/02/2014     Health Maintenance Due  Topic Date Due  . INFLUENZA VACCINE  11/30/2015     Review of Systems Review of Systems  Constitutional: Negative for fever, chills, diaphoresis, activity change, appetite change, fatigue and unexpected weight change.  HENT: Negative for congestion, sore throat, rhinorrhea, sneezing, trouble swallowing and sinus pressure.  Eyes: Negative for photophobia and visual disturbance.  Respiratory: Negative for cough, chest tightness, shortness of breath, wheezing and stridor.  Cardiovascular: Negative for chest pain, palpitations and leg swelling.  Gastrointestinal: Negative for nausea, vomiting, abdominal pain, diarrhea, constipation, blood in stool, abdominal distention and anal bleeding.    Genitourinary: Negative for dysuria, hematuria, flank pain and difficulty urinating.  Musculoskeletal: Negative for myalgias, back pain, joint swelling, arthralgias and gait problem.  Skin: Negative for color change, pallor, rash and wound.  Neurological: Negative for dizziness, tremors, weakness and light-headedness.  Hematological: Negative for adenopathy. Does not bruise/bleed easily.  Psychiatric/Behavioral: Negative for behavioral problems, confusion, sleep disturbance, dysphoric mood, decreased concentration and agitation.   Social History  Substance Use Topics  . Smoking status: Never Smoker  . Smokeless tobacco: Never Used  . Alcohol use No   Physical Exam   BP 99/65   Pulse 75   Temp 98.3 F (36.8 C) (Oral)   Wt 140 lb (63.5 kg)   LMP 06/27/2016 (Approximate)   BMI 25.61 kg/m   Physical Exam  Constitutional:  oriented to person, place, and time. appears well-developed and well-nourished. No distress.  HENT: [place]/AT, PERRLA, no scleral icterus Mouth/Throat: Oropharynx is clear and moist. No oropharyngeal exudate.  Cardiovascular: Normal rate, regular rhythm and normal heart sounds. Exam reveals no gallop and no friction rub.  No murmur heard.  Pulmonary/Chest: Effort normal and breath sounds normal. No respiratory distress.  has no wheezes.  Neck = supple, no nuchal rigidity Lymphadenopathy: no cervical adenopathy. No axillary adenopathy Neurological: alert and oriented to person, place, and time.  Skin: Skin is warm and dry. No rash noted. No erythema.  Psychiatric: a normal mood and affect.  behavior is normal.   Lab Results  Component Value Date   CD4TCELL 20 (L) 06/08/2016   Lab Results  Component Value Date   CD4TABS 450 06/08/2016   CD4TABS 360 (L) 04/07/2016   CD4TABS 600 10/07/2015   Lab Results  Component Value Date   HIV1RNAQUANT 23 (H) 06/08/2016   No results found for: HEPBSAB Lab Results  Component Value Date   LABRPR NON REAC 04/07/2016     CBC Lab Results  Component Value Date   WBC 5.0 04/07/2016   RBC 4.43 04/07/2016   HGB 12.6 04/07/2016   HCT 39.7 04/07/2016   PLT 228 04/07/2016   MCV 89.6 04/07/2016   MCH 28.4 04/07/2016   MCHC 31.7 (L) 04/07/2016   RDW 13.3 04/07/2016   LYMPHSABS 2.1 02/04/2015   MONOABS 0.4 02/04/2015   EOSABS 0.1 02/04/2015    BMET Lab Results  Component Value Date   NA 138 04/07/2016   K 3.7 04/07/2016   CL 107 04/07/2016   CO2 22 04/07/2016   GLUCOSE 81 04/07/2016   BUN 9 04/07/2016   CREATININE 0.53 04/07/2016   CALCIUM 9.1 04/07/2016   GFRNONAA >89 02/04/2015   GFRAA >89 02/04/2015      Assessment and Plan  hiv disease= well controlled by her labs, continue on current regimen  Insurance access = will refer to counseling to give options for her  Health maintenance = will give pneumococcal vacicne   Adherence counseling = spent 15 min in direct face to face time reinforcing importance of adhenrece

## 2016-07-18 MED FILL — TIVICAY 50 MG TABLET: 50 | 30 days supply | Qty: 30 | Fill #2

## 2016-07-18 MED FILL — DESCOVY 200-25 MG TABS: 200-25 | 30 days supply | Qty: 30 | Fill #2

## 2016-07-18 MED FILL — PREZCOBIX 800 MG-150 MG TAB: 800-150 | 30 days supply | Qty: 30 | Fill #2

## 2016-08-14 MED FILL — TIVICAY 50 MG TABLET: 50 | 30 days supply | Qty: 30 | Fill #3

## 2016-08-14 MED FILL — DESCOVY 200-25 MG TABS: 200-25 | 30 days supply | Qty: 30 | Fill #3

## 2016-08-14 MED FILL — PREZCOBIX 800 MG-150 MG TAB: 800-150 | 30 days supply | Qty: 30 | Fill #3

## 2016-09-14 MED FILL — TIVICAY 50 MG TABLET: 50 | 30 days supply | Qty: 30 | Fill #4

## 2016-09-14 MED FILL — DESCOVY 200-25 MG TABS: 200-25 | 30 days supply | Qty: 30 | Fill #4

## 2016-09-14 MED FILL — PREZCOBIX 800 MG-150 MG TAB: 800-150 | 30 days supply | Qty: 30 | Fill #4

## 2016-10-10 MED FILL — DESCOVY 200-25 MG TABS: 200-25 | 30 days supply | Qty: 30 | Fill #5

## 2016-10-10 MED FILL — TIVICAY 50 MG TABLET: 50 | 30 days supply | Qty: 30 | Fill #5

## 2016-10-10 MED FILL — PREZCOBIX 800 MG-150 MG TAB: 800-150 | 30 days supply | Qty: 30 | Fill #5

## 2016-11-14 MED FILL — DESCOVY 200-25 MG TABS: 200-25 | 30 days supply | Qty: 30 | Fill #6

## 2016-11-14 MED FILL — PREZCOBIX 800 MG-150 MG TAB: 800-150 | 30 days supply | Qty: 30 | Fill #6

## 2016-11-14 MED FILL — TIVICAY 50 MG TABLET: 50 | 30 days supply | Qty: 30 | Fill #6

## 2016-12-08 MED FILL — PREZCOBIX 800 MG-150 MG TAB: 800-150 | 30 days supply | Qty: 30 | Fill #7

## 2016-12-08 MED FILL — DESCOVY 200-25 MG TABS: 200-25 | 30 days supply | Qty: 30 | Fill #7

## 2016-12-08 MED FILL — TIVICAY 50 MG TABLET: 50 | 30 days supply | Qty: 30 | Fill #7

## 2017-01-04 MED FILL — DESCOVY 200-25 MG TABS: 200-25 | 30 days supply | Qty: 30 | Fill #8

## 2017-01-04 MED FILL — TIVICAY 50 MG TABLET: 50 | 30 days supply | Qty: 30 | Fill #8

## 2017-01-04 MED FILL — PREZCOBIX 800 MG-150 MG TAB: 800-150 | 30 days supply | Qty: 30 | Fill #8

## 2017-01-22 ENCOUNTER — Encounter: Payer: Self-pay | Admitting: Internal Medicine

## 2017-01-22 ENCOUNTER — Ambulatory Visit (INDEPENDENT_AMBULATORY_CARE_PROVIDER_SITE_OTHER): Payer: Commercial Managed Care - PPO | Admitting: Internal Medicine

## 2017-01-22 VITALS — BP 120/77 | HR 65 | Temp 97.5°F | Ht 62.0 in | Wt 146.0 lb

## 2017-01-22 DIAGNOSIS — B20 Human immunodeficiency virus [HIV] disease: Secondary | ICD-10-CM | POA: Diagnosis not present

## 2017-01-22 DIAGNOSIS — Z23 Encounter for immunization: Secondary | ICD-10-CM | POA: Diagnosis not present

## 2017-01-22 NOTE — Progress Notes (Signed)
RFV: follow up for hiv disease  Patient ID: Katherine [name], female   DOB: 1995/07/26, 21 y.o.   MRN: [id number]  HPI  Katherine [name] is a Katherine [name] with perinatally acquired hiv disease, currently on tivicay/descovy/DRVc. She was last seen in march. Cd 4 count of 450/vL23 in march. She states that she is working 2 part-time jobs at [employer]. 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  hiv geno:L100I, K103N, M184V, L210W, T215Y, K219N   Outpatient Encounter Prescriptions as of 01/22/2017  Medication Sig  . darunavir-cobicistat (PREZCOBIX) 800-150 MG tablet Take 1 tablet by mouth daily with breakfast. Swallow whole. Do NOT crush, break or chew tablets. Take with food.  . dolutegravir (TIVICAY) 50 MG tablet TAKE 1 TABLET (50 MG TOTAL) BY MOUTH DAILY.  Marland Kitchen emtricitabine-tenofovir AF (DESCOVY) 200-25 MG tablet Take 1 tablet by mouth daily.   No facility-administered encounter medications on file as of 01/22/2017.      Patient Active Problem List   Diagnosis Date Noted  . 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 H/O myopia 12/02/2014  . Developmental delay 12/02/2014  . Perinatal HIV exposure 12/02/2014     Health Maintenance Due  Topic Date Due  . INFLUENZA VACCINE  11/29/2016     Review of Systems  Constitutional: Negative for fever, chills, diaphoresis, activity change, appetite change, fatigue and unexpected weight change.  HENT: Negative for congestion, sore throat, rhinorrhea, sneezing, trouble swallowing and sinus pressure.  Eyes: Negative for photophobia and visual disturbance.  Respiratory: Negative for cough, chest tightness, shortness of breath, wheezing and stridor.  Cardiovascular: Negative for chest pain, palpitations and leg swelling.  Gastrointestinal: Negative for nausea, vomiting, abdominal pain, diarrhea, constipation, blood in stool, abdominal distention and anal bleeding.  Genitourinary: Negative for dysuria, hematuria, flank pain and  difficulty urinating.  Musculoskeletal: Negative for myalgias, back pain, joint swelling, arthralgias and gait problem.  Skin: Negative for color change, pallor, rash and wound.  Neurological: Negative for dizziness, tremors, weakness and light-headedness.  Hematological: Negative for adenopathy. Does not bruise/bleed easily.  Psychiatric/Behavioral: Negative for behavioral problems, confusion, sleep disturbance, dysphoric mood, decreased concentration and agitation.    Physical Exam  BP 120/77   Pulse 65   Temp (!) 97.5 [name] (36.4 C) (Oral)   Ht  (1.575 m)   Wt 146 lb (66.2 kg)   LMP 01/01/2017 (Approximate)   BMI 26.70 kg/m  Physical Exam  Constitutional:  oriented to person, place, and time. appears well-developed and well-nourished. No distress.  HENT: [place]/AT, PERRLA, no scleral icterus Mouth/Throat: Oropharynx is clear and moist. No oropharyngeal exudate.  Cardiovascular: Normal rate, regular rhythm and normal heart sounds. Exam reveals no gallop and no friction rub.  No murmur heard.  Pulmonary/Chest: Effort normal and breath sounds normal. No respiratory distress.  has no wheezes.  Neck = supple, no nuchal rigidity Lymphadenopathy: no cervical adenopathy. No axillary adenopathy Neurological: alert and oriented to person, place, and time.  Skin: Skin is warm and dry. No rash noted. No erythema. Slight acne. Psychiatric: a normal mood and affect.  behavior is normal.    Lab Results  Component Value Date   CD4TCELL 20 (L) 06/08/2016   Lab Results  Component Value Date   CD4TABS 450 06/08/2016   CD4TABS 360 (L) 04/07/2016   CD4TABS 600 10/07/2015   Lab Results  Component Value Date   HIV1RNAQUANT 23 (H) 06/08/2016   No results found for: HEPBSAB Lab Results  Component Value Date  LABRPR NON REAC 04/07/2016    CBC Lab Results  Component Value Date   WBC 5.0 04/07/2016   RBC 4.43 04/07/2016   HGB 12.6 04/07/2016   HCT 39.7 04/07/2016   PLT 228 04/07/2016    MCV 89.6 04/07/2016   MCH 28.4 04/07/2016   MCHC 31.7 (L) 04/07/2016   RDW 13.3 04/07/2016   LYMPHSABS 2.1 02/04/2015   MONOABS 0.4 02/04/2015   EOSABS 0.1 02/04/2015    BMET Lab Results  Component Value Date   NA 138 04/07/2016   K 3.7 04/07/2016   CL 107 04/07/2016   CO2 22 04/07/2016   GLUCOSE 81 04/07/2016   BUN 9 04/07/2016   CREATININE 0.53 04/07/2016   CALCIUM 9.1 04/07/2016   GFRNONAA >89 02/04/2015   GFRAA >89 02/04/2015   Assessment and Plan  hiv disease = will check labs and continue with current regimen.  Health maintenance = will give flu vaccine  Social hx: Not currently sexually active. No smoking or illicit drug use.

## 2017-01-23 LAB — CBC WITH DIFFERENTIAL/PLATELET
BASOS ABS: 13 {cells}/uL (ref 0–200)
BASOS PCT: 0.2 %
EOS PCT: 1.7 %
Eosinophils Absolute: 111 cells/uL (ref 15–500)
HEMATOCRIT: 37.1 % (ref 35.0–45.0)
Hemoglobin: 12.2 g/dL (ref 11.7–15.5)
LYMPHS ABS: 2607 {cells}/uL (ref 850–3900)
MCH: 30.1 pg (ref 27.0–33.0)
MCHC: 32.9 g/dL (ref 32.0–36.0)
MCV: 91.6 fL (ref 80.0–100.0)
MONOS PCT: 7.4 %
MPV: 10.1 fL (ref 7.5–12.5)
NEUTROS ABS: 3289 {cells}/uL (ref 1500–7800)
Neutrophils Relative %: 50.6 %
Platelets: 254 10*3/uL (ref 140–400)
RBC: 4.05 10*6/uL (ref 3.80–5.10)
RDW: 12.2 % (ref 11.0–15.0)
Total Lymphocyte: 40.1 %
WBC mixed population: 481 cells/uL (ref 200–950)
WBC: 6.5 10*3/uL (ref 3.8–10.8)

## 2017-01-23 LAB — COMPLETE METABOLIC PANEL WITH GFR
AG Ratio: 1.3 (calc) (ref 1.0–2.5)
ALKALINE PHOSPHATASE (APISO): 94 U/L (ref 33–115)
ALT: 17 U/L (ref 6–29)
AST: 26 U/L (ref 10–30)
Albumin: 4.1 g/dL (ref 3.6–5.1)
BUN: 12 mg/dL (ref 7–25)
CALCIUM: 9.7 mg/dL (ref 8.6–10.2)
CO2: 25 mmol/L (ref 20–32)
CREATININE: 0.79 mg/dL (ref 0.50–1.10)
Chloride: 107 mmol/L (ref 98–110)
GFR, EST NON AFRICAN AMERICAN: 108 mL/min/{1.73_m2} (ref >=60)
GFR, Est African American: 125 mL/min/{1.73_m2} (ref >=60)
GLOBULIN: 3.1 g/dL (ref 1.9–3.7)
GLUCOSE: 89 mg/dL (ref 65–99)
Potassium: 4.3 mmol/L (ref 3.5–5.3)
SODIUM: 139 mmol/L (ref 135–146)
Total Bilirubin: 0.3 mg/dL (ref 0.2–1.2)
Total Protein: 7.2 g/dL (ref 6.1–8.1)

## 2017-01-24 LAB — T-HELPER CELL (CD4) - (RCID CLINIC ONLY)
CD4 T CELL HELPER: 26 % — AB (ref 33–55)
CD4 T Cell Abs: 700 /uL (ref 400–2700)

## 2017-01-24 LAB — HIV-1 RNA QUANT-NO REFLEX-BLD
HIV 1 RNA QUANT: DETECTED {copies}/mL — AB
HIV-1 RNA QUANT, LOG: DETECTED {Log_copies}/mL — AB

## 2017-01-30 MED FILL — PREZCOBIX 800 MG-150 MG TAB: 800-150 | 30 days supply | Qty: 30 | Fill #9

## 2017-01-30 MED FILL — TIVICAY 50 MG TABLET: 50 | 30 days supply | Qty: 30 | Fill #9

## 2017-01-30 MED FILL — DESCOVY 200-25 MG TABS: 200-25 | 30 days supply | Qty: 30 | Fill #9

## 2017-02-22 MED FILL — PREZCOBIX 800 MG-150 MG TAB: 800-150 | 30 days supply | Qty: 30 | Fill #10

## 2017-02-22 MED FILL — DESCOVY 200-25 MG TABS: 200-25 | 30 days supply | Qty: 30 | Fill #10

## 2017-02-22 MED FILL — TIVICAY 50 MG TABLET: 50 | 30 days supply | Qty: 30 | Fill #10

## 2017-03-20 MED FILL — PREZCOBIX 800 MG-150 MG TAB: 800-150 | 30 days supply | Qty: 30 | Fill #11

## 2017-03-20 MED FILL — DESCOVY 200-25 MG TABS: 200-25 | 30 days supply | Qty: 30 | Fill #11

## 2017-03-20 MED FILL — TIVICAY 50 MG TABLET: 50 | 30 days supply | Qty: 30 | Fill #11

## 2017-04-06 ENCOUNTER — Other Ambulatory Visit: Payer: Self-pay | Admitting: Pharmacist

## 2017-04-12 ENCOUNTER — Other Ambulatory Visit: Payer: Self-pay | Admitting: Internal Medicine

## 2017-04-12 DIAGNOSIS — B20 Human immunodeficiency virus [HIV] disease: Secondary | ICD-10-CM

## 2017-05-11 MED FILL — TIVICAY 50 MG TABLET: 50 | 30 days supply | Qty: 30 | Fill #0

## 2017-05-11 MED FILL — DESCOVY 200-25 MG TABS: 200-25 | 30 days supply | Qty: 30 | Fill #0

## 2017-05-11 MED FILL — PREZCOBIX 800 MG-150 MG TAB: 800-150 | 30 days supply | Qty: 30 | Fill #0

## 2017-06-07 ENCOUNTER — Ambulatory Visit: Payer: Self-pay

## 2017-06-12 ENCOUNTER — Encounter: Payer: Self-pay | Admitting: Internal Medicine

## 2017-07-11 ENCOUNTER — Other Ambulatory Visit: Payer: Self-pay | Admitting: Pharmacist Clinician (PhC)/ Clinical Pharmacy Specialist

## 2017-07-11 DIAGNOSIS — B20 Human immunodeficiency virus [HIV] disease: Secondary | ICD-10-CM

## 2017-07-11 MED ORDER — DARUNAVIR-COBICISTAT 800-150 MG PO TABS: ORAL_TABLET | ORAL | 5 refills | Status: DC

## 2017-07-11 MED ORDER — DOLUTEGRAVIR SODIUM 50 MG PO TABS: 50.0000 mg | ORAL_TABLET | Freq: Every day | ORAL | 5 refills | Status: DC

## 2017-07-11 MED ORDER — EMTRICITABINE-TENOFOVIR AF 200-25 MG PO TABS: 1.0000 | ORAL_TABLET | Freq: Every day | ORAL | 5 refills | Status: DC

## 2017-07-11 NOTE — Progress Notes (Signed)
She has lost insurance now. Mom asked us to forward her [facility]tro[facility]. Olegario MessierKathy said that the ADAP has not been approved yet but should be soon.

## 2017-07-12 ENCOUNTER — Telehealth: Payer: Self-pay | Admitting: Pharmacist Clinician (PhC)/ Clinical Pharmacy Specialist

## 2017-07-12 ENCOUNTER — Other Ambulatory Visit: Payer: Self-pay | Admitting: Pharmacist Clinician (PhC)/ Clinical Pharmacy Specialist

## 2017-07-12 MED ORDER — DARUN-COBIC-EMTRICIT-TENOFAF 800-150-200-10 MG PO TABS: 1.0000 | ORAL_TABLET | Freq: Every day | ORAL | 0 refills | Status: DC

## 2017-07-12 MED FILL — SYMTUZA 800-150-200-10 MG T: 800-150-200 | 30 days supply | Qty: 30 | Fill #0

## 2017-07-12 MED FILL — TIVICAY 50 MG TABLET: 50 | 30 days supply | Qty: 30 | Fill #1

## 2017-07-12 NOTE — Telephone Encounter (Signed)
Katherine [name] has lost insurance per her mom. She filled out ADAP with Olegario MessierKathy a few weeks ago but it's still not approved yet. Since she is on Tivicay/prezcobix/descovy, we are going to use the [employer]tu[employer] card to get the 30d supply and VIIV approved her for 3 months. Explained to her mom that once she is approved she will have [facility] and if she wants it mailed then, we can transfer it to the one in [place]. Katherine [name] is about to move out into her apartment. The bridge regimen will be Symtuza/Tivicay>>>Tivicay/Descovy/Prezcobix (ADAP)

## 2017-07-13 NOTE — Telephone Encounter (Signed)
Sounds perfect. Thanks for bridging her.

## 2017-07-23 ENCOUNTER — Ambulatory Visit (INDEPENDENT_AMBULATORY_CARE_PROVIDER_SITE_OTHER): Payer: Self-pay | Admitting: Internal Medicine

## 2017-07-23 ENCOUNTER — Encounter: Payer: Self-pay | Admitting: Internal Medicine

## 2017-07-23 VITALS — BP 99/62 | HR 70 | Wt 146.0 lb

## 2017-07-23 DIAGNOSIS — B20 Human immunodeficiency virus [HIV] disease: Secondary | ICD-10-CM

## 2017-07-23 DIAGNOSIS — F819 Developmental disorder of scholastic skills, unspecified: Secondary | ICD-10-CM

## 2017-07-23 DIAGNOSIS — Z91199 Patient's noncompliance with other medical treatment and regimen due to unspecified reason: Secondary | ICD-10-CM

## 2017-07-23 DIAGNOSIS — Z9119 Patient's noncompliance with other medical treatment and regimen: Secondary | ICD-10-CM

## 2017-07-23 DIAGNOSIS — Z0000 Encounter for general adult medical examination without abnormal findings: Secondary | ICD-10-CM

## 2017-07-23 NOTE — Progress Notes (Signed)
RFV: follow up for hiv disease  Patient ID: Katherine [name], female   DOB: 1996/01/05, 22 y.o.   MRN: [id number]  HPI Katherine [name] is a 22yo F with perinatal acquired hiv disease. She recently ran out of insurance and medication. Now being bridged on tivicay-symtuza until her ADAP is approved. 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 She is soon to be moving out of her parents home and into a shared appt [place]. She shares with me her pictures of her 21st bday party. She has friends that she states can't relate to her parents. She is not sexually active.   Soc hx: no smoking or illicit drugs. Rare alcohol use.  Outpatient Encounter Medications as of 07/23/2017  Medication Sig  . darunavir-cobicistat (PREZCOBIX) 800-150 MG tablet TAKE 1 TABLET BY MOUTH DAILY WITH BREAKFAST. TAKE WITH FOOD.  Marland Kitchen dolutegravir (TIVICAY) 50 MG tablet Take 1 tablet (50 mg total) by mouth daily.  Marland Kitchen emtricitabine-tenofovir AF (DESCOVY) 200-25 MG tablet Take 1 tablet by mouth daily.  . Darunavir-Cobicisctat-Emtricitabine-Tenofovir Alafenamide (SYMTUZA) 800-150-200-10 MG TABS Take 1 tablet by mouth daily with breakfast. (Patient not taking: Reported on 07/23/2017)   No facility-administered encounter medications on file as of 07/23/2017.      Patient Active Problem List   Diagnosis Date Noted  . 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 H/O myopia 12/02/2014  . Developmental delay 12/02/2014  . Perinatal HIV exposure 12/02/2014     Health Maintenance Due  Topic Date Due  . PAP SMEAR  03/01/2017     Review of Systems Review of Systems  Constitutional: Negative for fever, chills, diaphoresis, activity change, appetite change, fatigue and unexpected weight change.  HENT: Negative for congestion, sore throat, rhinorrhea, sneezing, trouble swallowing and sinus pressure.  Eyes: Negative for photophobia and visual disturbance.  Respiratory:  Negative for cough, chest tightness, shortness of breath, wheezing and stridor.  Cardiovascular: Negative for chest pain, palpitations and leg swelling.  Gastrointestinal: Negative for nausea, vomiting, abdominal pain, diarrhea, constipation, blood in stool, abdominal distention and anal bleeding.  Genitourinary: Negative for dysuria, hematuria, flank pain and difficulty urinating.  Musculoskeletal: Negative for myalgias, back pain, joint swelling, arthralgias and gait problem.  Skin: Negative for color change, pallor, rash and wound.  Neurological: Negative for dizziness, tremors, weakness and light-headedness.  Hematological: Negative for adenopathy. Does not bruise/bleed easily.  Psychiatric/Behavioral: Negative for behavioral problems, confusion, sleep disturbance, dysphoric mood, decreased concentration and agitation.    Physical Exam   BP 99/62   Pulse 70   Wt 146 lb (66.2 kg)   LMP 06/25/2017 (Approximate)   BMI 26.70 kg/m   Physical Exam  Constitutional:  oriented to person, place, and time. appears well-developed and well-nourished. No distress.  HENT: [place]/AT, PERRLA, no scleral icterus Mouth/Throat: Oropharynx is clear and moist. No oropharyngeal exudate.  Cardiovascular: Normal rate, regular rhythm and normal heart sounds. Exam reveals no gallop and no friction rub.  No murmur heard.  Pulmonary/Chest: Effort normal and breath sounds normal. No respiratory distress.  has no wheezes.  Neck = supple, no nuchal rigidity Abdominal: Soft. Bowel sounds are normal.  exhibits no distension. There is no tenderness.  Lymphadenopathy: no cervical adenopathy. No axillary adenopathy Neurological: alert and oriented to person, place, and time.  Skin: Skin is warm and dry. No rash noted. No erythema.  Psychiatric: a normal mood and affect.  behavior is normal.   Lab Results  Component Value Date   CD4TCELL 26 (L) 01/22/2017   Lab Results  Component Value Date   CD4TABS 700 01/22/2017    CD4TABS 450 06/08/2016   CD4TABS 360 (L) 04/07/2016   Lab Results  Component Value Date   HIV1RNAQUANT <20 DETECTED (A) 01/22/2017   No results found for: HEPBSAB Lab Results  Component Value Date   LABRPR NON REAC 04/07/2016    CBC Lab Results  Component Value Date   WBC 6.5 01/22/2017   RBC 4.05 01/22/2017   HGB 12.2 01/22/2017   HCT 37.1 01/22/2017   PLT 254 01/22/2017   MCV 91.6 01/22/2017   MCH 30.1 01/22/2017   MCHC 32.9 01/22/2017   RDW 12.2 01/22/2017   LYMPHSABS 2,607 01/22/2017   MONOABS 0.4 02/04/2015   EOSABS 111 01/22/2017    BMET Lab Results  Component Value Date   NA 139 01/22/2017   K 4.3 01/22/2017   CL 107 01/22/2017   CO2 25 01/22/2017   GLUCOSE 89 01/22/2017   BUN 12 01/22/2017   CREATININE 0.79 01/22/2017   CALCIUM 9.7 01/22/2017   GFRNONAA 108 01/22/2017   GFRAA 125 01/22/2017      Assessment and Plan  hiv disease = Currently on tivicay and symtuza. Awaiting adap approval to get back to her regular regimen of tivicay/prezcobix/descovy Will get labs. Awaiting adap  Hx of partial adherence = discussed importance/counseled on medication adherence  Mild cognitive impairment/delay = she appears to be well connected with friends and jobs. Parents are working on having her be self-sufficient. She is showing good judgment thus far with not engaging in wreckless behavior per our discussion. She is looking forward to being on her own.   Health maintenance = at next visit, will give prevnar 13. No need for pap at this time, not sexually active. Discussed if she is to become sexually active, need for condoms and can consider other options for birth control  rtc in 3 months

## 2017-07-24 LAB — COMPLETE METABOLIC PANEL WITH GFR
AG RATIO: 1.5 (calc) (ref 1.0–2.5)
ALBUMIN MSPROF: 4.2 g/dL (ref 3.6–5.1)
ALKALINE PHOSPHATASE (APISO): 91 U/L (ref 33–115)
ALT: 12 U/L (ref 6–29)
AST: 14 U/L (ref 10–30)
BUN: 13 mg/dL (ref 7–25)
CO2: 25 mmol/L (ref 20–32)
Calcium: 9.6 mg/dL (ref 8.6–10.2)
Chloride: 105 mmol/L (ref 98–110)
Creat: 0.67 mg/dL (ref 0.50–1.10)
GFR, EST AFRICAN AMERICAN: 146 mL/min/{1.73_m2} (ref >=60)
GFR, Est Non African American: 126 mL/min/{1.73_m2} (ref >=60)
GLUCOSE: 82 mg/dL (ref 65–99)
Globulin: 2.8 g/dL (calc) (ref 1.9–3.7)
Potassium: 4.1 mmol/L (ref 3.5–5.3)
SODIUM: 138 mmol/L (ref 135–146)
Total Bilirubin: 0.3 mg/dL (ref 0.2–1.2)
Total Protein: 7 g/dL (ref 6.1–8.1)

## 2017-07-24 LAB — CBC WITH DIFFERENTIAL/PLATELET
BASOS ABS: 17 {cells}/uL (ref 0–200)
Basophils Relative: 0.3 %
EOS ABS: 151 {cells}/uL (ref 15–500)
Eosinophils Relative: 2.6 %
HCT: 36.4 % (ref 35.0–45.0)
Hemoglobin: 12.4 g/dL (ref 11.7–15.5)
Lymphs Abs: 2877 cells/uL (ref 850–3900)
MCH: 30.6 pg (ref 27.0–33.0)
MCHC: 34.1 g/dL (ref 32.0–36.0)
MCV: 89.9 fL (ref 80.0–100.0)
MONOS PCT: 7.4 %
MPV: 10.1 fL (ref 7.5–12.5)
NEUTROS PCT: 40.1 %
Neutro Abs: 2326 cells/uL (ref 1500–7800)
PLATELETS: 238 10*3/uL (ref 140–400)
RBC: 4.05 10*6/uL (ref 3.80–5.10)
RDW: 11.9 % (ref 11.0–15.0)
TOTAL LYMPHOCYTE: 49.6 %
WBC: 5.8 10*3/uL (ref 3.8–10.8)
WBCMIX: 429 {cells}/uL (ref 200–950)

## 2017-07-24 LAB — RPR: RPR: NONREACTIVE

## 2017-07-25 LAB — T-HELPER CELL (CD4) - (RCID CLINIC ONLY)
CD4 T CELL HELPER: 28 % — AB (ref 33–55)
CD4 T Cell Abs: 840 /uL (ref 400–2700)

## 2017-07-26 LAB — HIV-1 RNA QUANT-NO REFLEX-BLD
HIV 1 RNA Quant: <20 copies/mL — AB
HIV-1 RNA QUANT, LOG: DETECTED {Log_copies}/mL — AB

## 2017-08-13 ENCOUNTER — Other Ambulatory Visit: Payer: Self-pay | Admitting: Pharmacist Clinician (PhC)/ Clinical Pharmacy Specialist

## 2017-10-16 ENCOUNTER — Other Ambulatory Visit: Payer: Self-pay

## 2017-10-16 ENCOUNTER — Other Ambulatory Visit: Payer: Self-pay | Admitting: *Deleted

## 2017-10-16 DIAGNOSIS — B20 Human immunodeficiency virus [HIV] disease: Secondary | ICD-10-CM

## 2017-10-16 LAB — CBC WITH DIFFERENTIAL/PLATELET
BASOS PCT: 0.3 %
Basophils Absolute: 18 cells/uL (ref 0–200)
Eosinophils Absolute: 122 cells/uL (ref 15–500)
Eosinophils Relative: 2 %
HCT: 35.4 % (ref 35.0–45.0)
Hemoglobin: 11.8 g/dL (ref 11.7–15.5)
Lymphs Abs: 2013 cells/uL (ref 850–3900)
MCH: 29.9 pg (ref 27.0–33.0)
MCHC: 33.3 g/dL (ref 32.0–36.0)
MCV: 89.6 fL (ref 80.0–100.0)
MONOS PCT: 8.3 %
MPV: 10.1 fL (ref 7.5–12.5)
Neutro Abs: 3440 cells/uL (ref 1500–7800)
Neutrophils Relative %: 56.4 %
PLATELETS: 221 10*3/uL (ref 140–400)
RBC: 3.95 10*6/uL (ref 3.80–5.10)
RDW: 12.2 % (ref 11.0–15.0)
TOTAL LYMPHOCYTE: 33 %
WBC mixed population: 506 cells/uL (ref 200–950)
WBC: 6.1 10*3/uL (ref 3.8–10.8)

## 2017-10-16 LAB — COMPLETE METABOLIC PANEL WITH GFR
AG Ratio: 1.4 (calc) (ref 1.0–2.5)
ALKALINE PHOSPHATASE (APISO): 86 U/L (ref 33–115)
ALT: 11 U/L (ref 6–29)
AST: 14 U/L (ref 10–30)
Albumin: 4 g/dL (ref 3.6–5.1)
BUN: 15 mg/dL (ref 7–25)
CALCIUM: 9.1 mg/dL (ref 8.6–10.2)
CO2: 23 mmol/L (ref 20–32)
CREATININE: 0.65 mg/dL (ref 0.50–1.10)
Chloride: 108 mmol/L (ref 98–110)
GFR, EST NON AFRICAN AMERICAN: 127 mL/min/{1.73_m2} (ref >=60)
GFR, Est African American: 147 mL/min/{1.73_m2} (ref >=60)
GLUCOSE: 84 mg/dL (ref 65–99)
Globulin: 2.9 g/dL (calc) (ref 1.9–3.7)
Potassium: 3.7 mmol/L (ref 3.5–5.3)
Sodium: 137 mmol/L (ref 135–146)
Total Bilirubin: 0.5 mg/dL (ref 0.2–1.2)
Total Protein: 6.9 g/dL (ref 6.1–8.1)

## 2017-10-17 LAB — T-HELPER CELL (CD4) - (RCID CLINIC ONLY)
CD4 % Helper T Cell: 26 % — ABNORMAL LOW (ref 33–55)
CD4 T CELL ABS: 510 /uL (ref 400–2700)

## 2017-10-18 LAB — HIV-1 RNA QUANT-NO REFLEX-BLD
HIV 1 RNA Quant: <20 copies/mL
HIV-1 RNA Quant, Log: <1.3 Log copies/mL

## 2017-10-30 ENCOUNTER — Ambulatory Visit: Payer: Self-pay | Admitting: Internal Medicine

## 2017-11-12 ENCOUNTER — Emergency Department (HOSPITAL_COMMUNITY): Payer: Self-pay

## 2017-11-12 ENCOUNTER — Ambulatory Visit (INDEPENDENT_AMBULATORY_CARE_PROVIDER_SITE_OTHER): Payer: Self-pay | Admitting: Family

## 2017-11-12 ENCOUNTER — Telehealth: Payer: Self-pay | Admitting: *Deleted

## 2017-11-12 ENCOUNTER — Encounter: Payer: Self-pay | Admitting: Family

## 2017-11-12 ENCOUNTER — Emergency Department (HOSPITAL_COMMUNITY)
Admission: EM | Admit: 2017-11-12 | Discharge: 2017-11-12 | Disposition: A | Discharge status: Home / Self Care | Payer: Self-pay | Attending: Emergency Medicine | Admitting: Emergency Medicine

## 2017-11-12 ENCOUNTER — Encounter (HOSPITAL_COMMUNITY): Payer: Self-pay | Admitting: Emergency Medicine

## 2017-11-12 VITALS — BP 99/63 | HR 76 | Temp 98.2°F | Wt 140.0 lb

## 2017-11-12 DIAGNOSIS — N3090 Cystitis, unspecified without hematuria: Secondary | ICD-10-CM | POA: Insufficient documentation

## 2017-11-12 DIAGNOSIS — Z79899 Other long term (current) drug therapy: Secondary | ICD-10-CM | POA: Insufficient documentation

## 2017-11-12 DIAGNOSIS — B20 Human immunodeficiency virus [HIV] disease: Secondary | ICD-10-CM | POA: Insufficient documentation

## 2017-11-12 DIAGNOSIS — B9689 Other specified bacterial agents as the cause of diseases classified elsewhere: Secondary | ICD-10-CM

## 2017-11-12 DIAGNOSIS — R9389 Abnormal findings on diagnostic imaging of other specified body structures: Secondary | ICD-10-CM

## 2017-11-12 DIAGNOSIS — R935 Abnormal findings on diagnostic imaging of other abdominal regions, including retroperitoneum: Secondary | ICD-10-CM | POA: Insufficient documentation

## 2017-11-12 DIAGNOSIS — R109 Unspecified abdominal pain: Secondary | ICD-10-CM

## 2017-11-12 DIAGNOSIS — N760 Acute vaginitis: Secondary | ICD-10-CM | POA: Insufficient documentation

## 2017-11-12 DIAGNOSIS — R1010 Upper abdominal pain, unspecified: Secondary | ICD-10-CM

## 2017-11-12 HISTORY — DX: Human immunodeficiency virus (HIV) disease: B20

## 2017-11-12 HISTORY — DX: Asymptomatic human immunodeficiency virus (hiv) infection status: Z21

## 2017-11-12 LAB — WET PREP, GENITAL
Sperm: NONE SEEN
Trich, Wet Prep: NONE SEEN
Yeast Wet Prep HPF POC: NONE SEEN

## 2017-11-12 LAB — COMPREHENSIVE METABOLIC PANEL
ALT: 14 U/L (ref 0–44)
ANION GAP: 9 (ref 5–15)
AST: 13 U/L — AB (ref 15–41)
Albumin: 3.5 g/dL (ref 3.5–5.0)
Alkaline Phosphatase: 84 U/L (ref 38–126)
BUN: 10 mg/dL (ref 6–20)
CHLORIDE: 106 mmol/L (ref 98–111)
CO2: 24 mmol/L (ref 22–32)
Calcium: 9.2 mg/dL (ref 8.9–10.3)
Creatinine, Ser: 0.6 mg/dL (ref 0.44–1.00)
GFR calc Af Amer: >60 mL/min (ref >60)
Glucose, Bld: 89 mg/dL (ref 70–99)
POTASSIUM: 3.5 mmol/L (ref 3.5–5.1)
Sodium: 139 mmol/L (ref 135–145)
Total Bilirubin: 0.7 mg/dL (ref 0.3–1.2)
Total Protein: 7.9 g/dL (ref 6.5–8.1)

## 2017-11-12 LAB — URINALYSIS, ROUTINE W REFLEX MICROSCOPIC
BACTERIA UA: NONE SEEN
BILIRUBIN URINE: NEGATIVE
Glucose, UA: NEGATIVE mg/dL
HGB URINE DIPSTICK: NEGATIVE
KETONES UR: NEGATIVE mg/dL
NITRITE: NEGATIVE
PROTEIN: NEGATIVE mg/dL
Specific Gravity, Urine: 1.023 (ref 1.005–1.030)
pH: 6 (ref 5.0–8.0)

## 2017-11-12 LAB — I-STAT BETA HCG BLOOD, ED (MC, WL, AP ONLY)

## 2017-11-12 LAB — CBC
HEMATOCRIT: 36.1 % (ref 36.0–46.0)
HEMOGLOBIN: 11.8 g/dL — AB (ref 12.0–15.0)
MCH: 29.8 pg (ref 26.0–34.0)
MCHC: 32.7 g/dL (ref 30.0–36.0)
MCV: 91.2 fL (ref 78.0–100.0)
Platelets: 253 10*3/uL (ref 150–400)
RBC: 3.96 MIL/uL (ref 3.87–5.11)
RDW: 12.2 % (ref 11.5–15.5)
WBC: 7.8 10*3/uL (ref 4.0–10.5)

## 2017-11-12 LAB — LIPASE, BLOOD: Lipase: 44 U/L (ref 11–51)

## 2017-11-12 MED ORDER — ONDANSETRON HCL 4 MG/2ML IJ SOLN
4.0000 mg | Freq: Once | INTRAMUSCULAR | Status: AC
  Administered 2017-11-12: 4 mg via INTRAVENOUS
  Filled 2017-11-12: qty 2

## 2017-11-12 MED ORDER — CEPHALEXIN 500 MG PO CAPS: 500.0000 mg | ORAL_CAPSULE | Freq: Four times a day (QID) | ORAL | 0 refills | Status: DC

## 2017-11-12 MED ORDER — METRONIDAZOLE 500 MG PO TABS: 500.0000 mg | ORAL_TABLET | Freq: Two times a day (BID) | ORAL | 0 refills | Status: AC

## 2017-11-12 MED ORDER — ONDANSETRON HCL 4 MG PO TABS: 4.0000 mg | ORAL_TABLET | Freq: Three times a day (TID) | ORAL | 0 refills | Status: DC | PRN

## 2017-11-12 MED ORDER — IOPAMIDOL (ISOVUE-300) INJECTION 61%
INTRAVENOUS | Status: AC
  Filled 2017-11-12: qty 100

## 2017-11-12 MED ORDER — IOPAMIDOL (ISOVUE-300) INJECTION 61%
100.0000 mL | Freq: Once | INTRAVENOUS | Status: AC | PRN
  Administered 2017-11-12: 100 mL via INTRAVENOUS

## 2017-11-12 MED ORDER — MORPHINE SULFATE (PF) 4 MG/ML IV SOLN
4.0000 mg | Freq: Once | INTRAVENOUS | Status: AC
  Administered 2017-11-12: 4 mg via INTRAVENOUS
  Filled 2017-11-12: qty 1

## 2017-11-12 MED ORDER — SODIUM CHLORIDE 0.9 % IV SOLN
1.0000 g | Freq: Once | INTRAVENOUS | Status: AC
  Administered 2017-11-12: 1 g via INTRAVENOUS
  Filled 2017-11-12: qty 10

## 2017-11-12 MED ORDER — SODIUM CHLORIDE 0.9 % IV BOLUS
1000.0000 mL | Freq: Once | INTRAVENOUS | Status: AC
  Administered 2017-11-12: 1000 mL via INTRAVENOUS

## 2017-11-12 MED ORDER — DOXYCYCLINE HYCLATE 100 MG PO CAPS: 100.0000 mg | ORAL_CAPSULE | Freq: Two times a day (BID) | ORAL | 0 refills | Status: DC

## 2017-11-12 NOTE — ED Triage Notes (Signed)
Patient sent from PCP for further evaluation of right side abdominal pain x1 week. Denies N/V/D. Reports sharp pain worsens with movement. Hx HIV. Reports following up closely with ID and taking medications as prescribed.

## 2017-11-12 NOTE — Discharge Instructions (Signed)
You were seen here today for abdominal pain.  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  I would like you to follow up with your infectious disease doctor by the end of the week. Please take all of your antibiotics until finished!   You may develop abdominal discomfort or diarrhea from the antibiotic.  You may help offset this with probiotics which you can buy or get in yogurt. Do not eat or take the probiotics until 2 hours after your antibiotic. Do not take your medicine if develop an itchy rash, swelling in your mouth or lips, or difficulty breathing.  Please do not drink alcohol while taking Flagyl as it will make you feel very ill Please take Zofran as needed for nausea.  Please see attached handouts.  If you develop worsening or new concerning symptoms you can return to the emergency department for re-evaluation.   Your Abdominal CT scan showed 1. Abnormal hepatic capsular and subcapsular enhancement compatible with perihepatitis. This is most commonly associated with pelvic inflammatory disease (Fitz-Hugh-Curtis syndrome). With regard to assessment of the pelvis for pelvic inflammatory disease, there is a small amount of free pelvic fluid adjacent to the uterus along with some indistinctness of the endometrial stripe and accentuated stranding in the mesentery close to the uterus.  2. Both ovaries are mildly enlarged and probably have accentuated peripheral follicles. This can sometimes be seen in the setting of polycystic ovary disease. Pelvic sonography could be useful in further assessment. I do not see a tubo-ovarian abscess. 3. Small amount of perihepatic ascites. 4. 2 pleural-based nodules in the left lower lobe, the larger 0.6 cm in average diameter. In this age group, such nodules are highly likely to be benign, and do not trigger recommendation for follow up. 5. Mild atelectasis anteriorly in the right lower  lobe with a trace amount of pleural fluid.  Your Ultrasound findings show  1. Mildly enlarged ovaries with prominent peripherally located follicles, which can be seen in the setting of PCOS. Correlation with history, laboratory values, and symptomatology suggested. 2. Trace free fluid within the pelvis, nonspecific, but could be physiologic or reactive. 3. Otherwise unremarkable pelvic ultrasound. No evidence for torsion or other acute abnormality.

## 2017-11-12 NOTE — Progress Notes (Signed)
   Subjective:    Patient ID: Katherine [name], female    DOB: 04/30/1996, 22 y.o.   MRN: [id number]  Chief Complaint  Patient presents with  . Abdominal Pain     HPI:  Katherine Mandesatricia Bosshart is a 22 y.o. female who presents today for an acute office visit.   This is a new problem. Associated symptom of pain located in her upper abdomen and described as constant and sharp has been going on for about 1 week. Severity described as worse than cramps. She has experienced a decreased appetite over the course of the week. Last menstrual cycle was 2 weeks ago. Last bowel movement was this morning.  Denies fever, chills, sweats, nausea, vomiting, constipation or diarrhea. She has not attempted any treatments or taken any medications. No previous history of abdominal issues. No new changes in medications.    No Known Allergies    Outpatient Medications Prior to Visit  Medication Sig Dispense Refill  . darunavir-cobicistat (PREZCOBIX) 800-150 MG tablet TAKE 1 TABLET BY MOUTH DAILY WITH BREAKFAST. TAKE WITH FOOD. 30 tablet 5  . dolutegravir (TIVICAY) 50 MG tablet Take 1 tablet (50 mg total) by mouth daily. 30 tablet 5  . emtricitabine-tenofovir AF (DESCOVY) 200-25 MG tablet Take 1 tablet by mouth daily. 30 tablet 5   No facility-administered medications prior to visit.      Past Medical History:  Diagnosis Date  . Tuberculosis     History reviewed. No pertinent surgical history.   Review of Systems  Constitutional: Negative for chills and fever.  Respiratory: Negative for cough, chest tightness and shortness of breath.   Cardiovascular: Negative for chest pain.  Gastrointestinal: Positive for abdominal pain. Negative for constipation, diarrhea, nausea and vomiting.      Objective:    BP 99/63   Pulse 76   Temp 98.2 F (36.8 C) (Oral)   Wt 140 lb (63.5 kg)   LMP 10/29/2017   BMI 25.61 kg/m  Nursing note and vital signs reviewed.  Physical Exam  Constitutional: She is  oriented to person, place, and time. She appears well-developed and well-nourished. No distress.  Cardiovascular: Normal rate, regular rhythm, normal heart sounds and intact distal pulses.  Pulmonary/Chest: Effort normal and breath sounds normal.  Abdominal: Normal appearance and bowel sounds are normal. There is tenderness in the right upper quadrant, epigastric area and left upper quadrant. There is guarding. There is no rigidity, no rebound, no tenderness at McBurney's point and negative Murphy's sign.  Neurological: She is alert and oriented to person, place, and time.  Skin: Skin is warm and dry.  Psychiatric: She has a normal mood and affect. Her behavior is normal. Judgment and thought content normal.       Assessment & Plan:   Problem List Items Addressed This Visit      Other   Upper abdominal pain - Primary    New onset acute upper abdominal pain going on for 1 week concerning for pancreatitis or cholecystitis. Does not appear to be peritonitis as she is able to lie flat and tolerate lower abdominal palpation. Given her current abdominal exam and level of guarding, recommend following up in the ED for further lab work and imaging.           I am having Katherine Mandesatricia Feeback maintain her emtricitabine-tenofovir AF, darunavir-cobicistat, and dolutegravir.   Follow-up: Return if symptoms worsen or fail to improve.   Marcos EkeGreg Harman Langhans, MSN, [facility]

## 2017-11-12 NOTE — Telephone Encounter (Signed)
Patient mother called and advised that she has had a pain in her side for about 1 week. She describes the pain as sharp, constant and increasing. No fever, nausea or vomiting. She would like to be seen today. Offered her and appointment today with Marcos EkeGreg Calone NP and she accepted 11/12/17 @ 1130.

## 2017-11-12 NOTE — ED Provider Notes (Signed)
[facility]-EMERGENCY DEPT Provider Note   CSN: [id number] Arrival date & time: 11/12/17  1258     History   Chief Complaint Chief Complaint  Patient presents with  . Abdominal Pain    HPI Laquonda Welby is a 22 y.o. female.  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  History is obtained by patient as well as patient's mother.  It appears patient has had intermittent right sided abdominal pain that radiates from her flank to her upper abdomen to her lower abdomen over the last 1 week.  Patient reports that the pain is cramping and sometimes sharp in nature but worse than menstrual cramps.  She rates her current pain levels a 9/10.  She reports that the pain is worse with palpation as well as movement.  She notes associated anorexia as well as nausea.  She denies any fever, chills, burping/belching, emesis, diarrhea, urinary frequency, urinary urgency, dysuria, hematuria, vaginal discharge, vaginal bleeding.  She has no known prior abdominal surgeries.  She notes she is not sexually active & reports she has never been sexually active in the past.  She denies any pelvic pain.  She notes her last menstrual cycle was on 10/29/2017.  She she denies history of similar pain in the past.  She has not been taking anything for symptoms.  HPI  Past Medical History:  Diagnosis Date  . HIV (human immunodeficiency virus infection) (HCC)   . Tuberculosis     Patient Active Problem List   Diagnosis Date Noted  . Upper abdominal pain 11/12/2017  . 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 H/O myopia 12/02/2014  . Developmental delay 12/02/2014  . Perinatal HIV exposure 12/02/2014    History reviewed. No pertinent surgical history.   OB History   None      Home Medications    Prior to Admission medications   Medication Sig Start Date End Date Taking?  Authorizing Provider  darunavir-cobicistat (PREZCOBIX) 800-150 MG tablet TAKE 1 TABLET BY MOUTH DAILY WITH BREAKFAST. TAKE WITH FOOD. Patient taking differently: Take 1 tablet by mouth at bedtime.  07/11/17  Yes Judyann Munson, MD  dolutegravir (TIVICAY) 50 MG tablet Take 1 tablet (50 mg total) by mouth daily. Patient taking differently: Take 50 mg by mouth at bedtime.  07/11/17  Yes Judyann Munson, MD  emtricitabine-tenofovir AF (DESCOVY) 200-25 MG tablet Take 1 tablet by mouth daily. Patient taking differently: Take 1 tablet by mouth at bedtime.  07/11/17  Yes Judyann Munson, MD    Family History Family History  Adopted: Yes  Problem Relation Age of Onset  . HIV/AIDS Mother     Social History Social History   Tobacco Use  . Smoking status: Never Smoker  . Smokeless tobacco: Never Used  Substance Use Topics  . Alcohol use: No    Alcohol/week: 0.0 oz  . Drug use: No     Allergies   Patient has no known allergies.   Review of Systems Review of Systems  All other systems reviewed and are negative.    Physical Exam Updated Vital Signs BP 117/71   Pulse 71   Temp 98 F (36.7 C) (Oral)   Resp 16   LMP 10/29/2017   SpO2 100%   Physical Exam  Constitutional: She appears well-developed and well-nourished.  HENT:  Head: Normocephalic and atraumatic.  Right Ear: External ear normal.  Left Ear: External ear  normal.  Nose: Nose normal.  Mouth/Throat: Uvula is midline, oropharynx is clear and moist and mucous membranes are normal. No tonsillar exudate.  Eyes: Pupils are equal, round, and reactive to light. Right eye exhibits no discharge. Left eye exhibits no discharge. No scleral icterus.  Neck: Trachea normal. Neck supple. No spinous process tenderness present. No neck rigidity. Normal range of motion present.  Cardiovascular: Normal rate, regular rhythm and intact distal pulses.  No murmur heard. Pulses:      Radial pulses are 2+ on the right side, and 2+ on the  left side.       Dorsalis pedis pulses are 2+ on the right side, and 2+ on the left side.       Posterior tibial pulses are 2+ on the right side, and 2+ on the left side.  No lower extremity swelling or edema. Calves symmetric in size bilaterally.  Pulmonary/Chest: Effort normal and breath sounds normal. She exhibits no tenderness.  Abdominal: Soft. Bowel sounds are normal. There is tenderness in the right upper quadrant, right lower quadrant and suprapubic area. There is tenderness at McBurney's point. There is no rigidity, no rebound, no guarding and no CVA tenderness.  Genitourinary:  Genitourinary Comments: Exam performed by Jacinto Halim, exam chaperoned Pelvic exam: normal external genitalia without evidence of trauma. VULVA: normal appearing vulva with no masses, tenderness or lesion. VAGINA: normal appearing vagina with normal color and discharge, no lesions. CERVIX: normal appearing cervix without lesions, cervical motion tenderness absent, cervical os closed with out purulent discharge; vaginal discharge - white and creamy, Wet prep and DNA probe for chlamydia and GC obtained.   ADNEXA: normal adnexa in size, nontender and no masses UTERUS: uterus is normal size, shape, consistency and nontender.   Musculoskeletal: She exhibits no edema.  Lymphadenopathy:    She has no cervical adenopathy.  Neurological: She is alert.  Skin: Skin is warm and dry. No rash noted. She is not diaphoretic.  Psychiatric: She has a normal mood and affect.  Nursing note and vitals reviewed.    ED Treatments / Results  Labs (all labs ordered are listed, but only abnormal results are displayed) Labs Reviewed  WET PREP, GENITAL - Abnormal; Notable for the following components:      Result Value   Clue Cells Wet Prep HPF POC PRESENT (*)    WBC, Wet Prep HPF POC MANY (*)    All other components within normal limits  COMPREHENSIVE METABOLIC PANEL - Abnormal; Notable for the following components:    AST 13 (*)    All other components within normal limits  CBC - Abnormal; Notable for the following components:   Hemoglobin 11.8 (*)    All other components within normal limits  URINALYSIS, ROUTINE W REFLEX MICROSCOPIC - Abnormal; Notable for the following components:   Leukocytes, UA SMALL (*)    All other components within normal limits  URINE CULTURE  LIPASE, BLOOD  RPR  I-STAT BETA HCG BLOOD, ED ([facility], [facility], [facility] ONLY)  GC/CHLAMYDIA PROBE AMP ([place]) NOT AT [facility]    EKG None  Radiology US Pelvis Complete  Result Date: 11/12/2017 CLINICAL DATA:  Initial evaluation for acute lower abdominal pain. Follow-up on prior CT. EXAM: TRANSABDOMINAL ULTRASOUND OF PELVIS DOPPLER ULTRASOUND OF OVARIES TECHNIQUE: Transabdominal ultrasound examination of the pelvis was performed including evaluation of the uterus, ovaries, adnexal regions, and pelvic cul-de-sac. Color and duplex Doppler ultrasound was utilized to evaluate blood flow to the ovaries. COMPARISON:  Prior CT  from earlier the same day. FINDINGS: Uterus Measurements: 6.3 x 2.8 x 4.3 cm. No fibroids or other mass visualized. Endometrium Thickness: 5 mm.  No focal abnormality visualized. Right ovary Measurements: 4.3 x 1.9 x 2.5 cm. Volume equals 10.8 cubic cm. Somewhat prominent peripherally located follicles. Normal appearance/no adnexal mass. Left ovary Measurements: 3.6 x 2.4 x 2.6 cm. Volume equals 11.6 cubic cm. Somewhat prominent peripherally located follicles. Normal appearance/no adnexal mass. Pulsed Doppler evaluation demonstrates normal low-resistance arterial and venous waveforms in both ovaries. Other: Trace free fluid noted within the pelvis. IMPRESSION: 1. Mildly enlarged ovaries with prominent peripherally located follicles, which can be seen in the setting of PCOS. Correlation with history, laboratory values, and symptomatology suggested. 2. Trace free fluid within the pelvis, nonspecific, but could be physiologic or reactive. 3.  Otherwise unremarkable pelvic ultrasound. No evidence for torsion or other acute abnormality. Electronically Signed   By: Rise MuBenjamin  McClintock M.D.   On: 11/12/2017 21:40   Ct Abdomen Pelvis W Contrast  Result Date: 11/12/2017 CLINICAL DATA:  Abdominal pain for 1 week.  Nausea and vomiting. EXAM: CT ABDOMEN AND PELVIS WITH CONTRAST TECHNIQUE: Multidetector CT imaging of the abdomen and pelvis was performed using the standard protocol following bolus administration of intravenous contrast. CONTRAST:  100mL ISOVUE-300 IOPAMIDOL (ISOVUE-300) INJECTION 61% COMPARISON:  Abdominal ultrasound dated 11/12/2017 FINDINGS: Lower chest: 0.7 by 0.5 cm pleural-based nodule in the left lower lobe on image 1/6, only partially included on today's exam. Adjacent 4 mm pleural-based nodule on image 4/6. Airway thickening in both lower lobes with mild mosaic attenuation. Mild atelectasis anteriorly in the right middle lobe. Trace pleural fluid anteriorly on the right. Hepatobiliary: Mildly accentuated capsular and subcapsular enhancement in the liver. Gallbladder unremarkable. Trace perihepatic ascites. Pancreas: Unremarkable Spleen: Unremarkable Adrenals/Urinary Tract: Unremarkable Stomach/Bowel: There a normal appearing segment of appendix shown on images 55-58 of series 2, and accordingly do not have high suspicion for acute appendicitis. Vascular/Lymphatic: Unremarkable Reproductive: The left ovary measures 4.2 by 2.5 by 4.2 cm (volume = 23 cm^3) and the right ovary measures 4.3 by 2.6 by 4.1 cm (volume = 24 cm^3), both mildly prominent. Both ovaries have mild prominence of peripheral follicles. There is a small amount of free pelvic fluid adjacent to the uterus and some indistinctness of the endometrial stripe. Trace free fluid in the cul-de-sac and probably anterior to the uterus as well. Localized stranding in the adjacent pelvic mesentery. Other: No supplemental non-categorized findings. Musculoskeletal: Unremarkable  IMPRESSION: 1. Abnormal hepatic capsular and subcapsular enhancement compatible with perihepatitis. This is most commonly associated with pelvic inflammatory disease (Fitz-Hugh-Curtis syndrome). With regard to assessment of the pelvis for pelvic inflammatory disease, there is a small amount of free pelvic fluid adjacent to the uterus along with some indistinctness of the endometrial stripe and accentuated stranding in the mesentery close to the uterus. Correlate with pelvic exam portion of physical exam. 2. Both ovaries are mildly enlarged and probably have accentuated peripheral follicles. This can sometimes be seen in the setting of polycystic ovary disease. Pelvic sonography could be useful in further assessment. I do not see a tubo-ovarian abscess. 3. Small amount of perihepatic ascites. 4. 2 pleural-based nodules in the left lower lobe, the larger 0.6 cm in average diameter. In this age group, such nodules are highly likely to be benign, and do not trigger recommendation for follow up. 5. Mild atelectasis anteriorly in the right lower lobe with a trace amount of pleural fluid. Electronically Signed   By: Zollie BeckersWalter  Ova Freshwater M.D.   On: 11/12/2017 19:19   [place] Art/ven Flow Abd Pelv Doppler  Result Date: 11/12/2017 CLINICAL DATA:  Initial evaluation for acute lower abdominal pain. Follow-up on prior CT. EXAM: TRANSABDOMINAL ULTRASOUND OF PELVIS DOPPLER ULTRASOUND OF OVARIES TECHNIQUE: Transabdominal ultrasound examination of the pelvis was performed including evaluation of the uterus, ovaries, adnexal regions, and pelvic cul-de-sac. Color and duplex Doppler ultrasound was utilized to evaluate blood flow to the ovaries. COMPARISON:  Prior CT from earlier the same day. FINDINGS: Uterus Measurements: 6.3 x 2.8 x 4.3 cm. No fibroids or other mass visualized. Endometrium Thickness: 5 mm.  No focal abnormality visualized. Right ovary Measurements: 4.3 x 1.9 x 2.5 cm. Volume equals 10.8 cubic cm. Somewhat prominent  peripherally located follicles. Normal appearance/no adnexal mass. Left ovary Measurements: 3.6 x 2.4 x 2.6 cm. Volume equals 11.6 cubic cm. Somewhat prominent peripherally located follicles. Normal appearance/no adnexal mass. Pulsed Doppler evaluation demonstrates normal low-resistance arterial and venous waveforms in both ovaries. Other: Trace free fluid noted within the pelvis. IMPRESSION: 1. Mildly enlarged ovaries with prominent peripherally located follicles, which can be seen in the setting of PCOS. Correlation with history, laboratory values, and symptomatology suggested. 2. Trace free fluid within the pelvis, nonspecific, but could be physiologic or reactive. 3. Otherwise unremarkable pelvic ultrasound. No evidence for torsion or other acute abnormality. Electronically Signed   By: Rise Mu M.D.   On: 11/12/2017 21:40   US Abdomen Limited Ruq  Result Date: 11/12/2017 CLINICAL DATA:  Right-sided abdominal pain for the past week. EXAM: ULTRASOUND ABDOMEN LIMITED RIGHT UPPER QUADRANT COMPARISON:  CT abdomen pelvis-earlier same day FINDINGS: Gallbladder: Normal sonographic appearance of the gallbladder. No echogenic stones or biliary sludge. No gallbladder wall thickening or pericholecystic fluid. Negative sonographic Murphy's sign. Common bile duct: Diameter: Normal in size measuring 3.6 mm in diameter Liver: Normal sonographic appearance of the liver. No discrete hepatic lesions. No intrahepatic biliary ductal dilatation. No ascites. Portal vein is patent on color Doppler imaging with normal direction of blood flow towards the liver. IMPRESSION: No explanation for patient's right upper quadrant abdominal pain. Specifically, no evidence of cholelithiasis. Electronically Signed   By: Simonne Come M.D.   On: 11/12/2017 18:57    Procedures Procedures (including critical care time)  Medications Ordered in ED Medications  iopamidol (ISOVUE-300) 61 % injection (has no administration in time  range)  sodium chloride 0.9 % bolus 1,000 mL (0 mLs Intravenous Stopped 11/12/17 2018)  ondansetron (ZOFRAN) injection 4 mg (4 mg Intravenous Given 11/12/17 1803)  morphine 4 MG/ML injection 4 mg (4 mg Intravenous Given 11/12/17 1804)  iopamidol (ISOVUE-300) 61 % injection 100 mL (100 mLs Intravenous Contrast Given 11/12/17 1845)  cefTRIAXone (ROCEPHIN) 1 g in sodium chloride 0.9 % 100 mL IVPB (0 g Intravenous Stopped 11/12/17 2223)     Initial Impression / Assessment and Plan / ED Course  I have reviewed the triage vital signs and the nursing notes.  Pertinent labs & imaging results that were available during my care of the patient were reviewed by me and considered in my medical decision making (see chart for details).     22 year old female with a history of HIV contracted at birth with a last CD4 count of 510 on 6/18 presenting to the emergency department today for right-sided abdominal pain that has been intermittent between the flank, upper quadrant and lower quadrant.  There is associated anorexia as well as nausea but no fever, emesis, diarrhea, urinary symptoms, vaginal discharge or  vaginal bleeding.  She reports she has never been sexually active before.  She denies any pelvic pain.  On presentation her vital signs are reassuring.  She is without fever, tachycardia, tachypnea, hypoxia or hypotension. Abdominal pain is generalized but greater in the RLQ with + McBurney's point tenderness.  Will obtain lab work and CT to evaluate.  On chart review patient did not see her NP today who was concerned for possible gallbladder pathology.  I do not appreciate any quadrant tenderness and patient is a negative Murphy sign however will obtain right upper quadrant ultrasound to evaluate given previous providers concern.  Patient given IV fluid, pain medication and nausea medication.  Patient is without leukocytosis.  Mild anemia.  She denies any melena or hematochezia.  No bloody emesis.  No significant  electrolight derangements.  No acute kidney injury.  LFTs without elevation.  Bilirubin without elevation.  No evidence of DKA.  Lipase within normal limits.  No evidence of pancreatitis.  Right upper quadrant ultrasound unremarkable and without evidence of cholecystitis.  UA is with evidence of UTI.  Given patient's immunocompromised state urine culture sent.  1 g of IV ceftriaxone ordered.  CT of the abdomen reveals likely [employer] syndrome with concerns for PID as well as enlargement of both ovaries.  Pelvic exam was performed without any cervical motion or adnexal tenderness.  There was some discharge noted.  Pelvic ultrasound performed with enlarged ovaries in the setting of possible PCOS.  There is no evidence of torsion or tubo-ovarian abscess.  Wet Prep is positive for bacterial vaginosis.  Spoke with Sharyl Nimrod in pharmacy as well as Dr. Rush Landmark in regards to patients results. Recommendation for doxycycline, flagyl, keflex given.   Discharge with above regimen.  Repeat abdominal exam without any peritoneal signs.  Patient without surgical abdomen.  Discussed findings with patient and her mother.  State understanding.  They are to follow-up with infectious disease doctor by the end of the week.  Strict return precautions were discussed.  Patient is hemodynamically stable and appears safe for discharge.  Patient case discussed with Dr. Rush Landmark who is in agreement with plan.  Final Clinical Impressions(s) / ED Diagnoses   Final diagnoses:  Right sided abdominal pain  Abnormal CT scan  Cystitis  History of HIV infection (HCC)  Bacterial vaginosis    ED Discharge Orders        Ordered    ondansetron (ZOFRAN) 4 MG tablet  Every 8 hours PRN     11/12/17 2229    doxycycline (VIBRAMYCIN) 100 MG capsule  2 times daily     11/12/17 2229    cephALEXin (KEFLEX) 500 MG capsule  4 times daily     11/12/17 2229    metroNIDAZOLE (FLAGYL) 500 MG tablet  2 times daily     11/12/17 2229        Princella Pellegrini 11/13/17 1620    Tegeler, Canary Brim, MD 11/13/17 [phone]

## 2017-11-12 NOTE — Patient Instructions (Signed)
Nice to meet you.  I would recommend that you go to the Emergency Department as this is the quickest way to evaluate your abdomen.

## 2017-11-12 NOTE — ED Notes (Signed)
Pt denies N/V/D

## 2017-11-12 NOTE — Assessment & Plan Note (Signed)
New onset acute upper abdominal pain going on for 1 week concerning for pancreatitis or cholecystitis. Does not appear to be peritonitis as she is able to lie flat and tolerate lower abdominal palpation. Given her current abdominal exam and level of guarding, recommend following up in the ED for further lab work and imaging.

## 2017-11-13 LAB — GC/CHLAMYDIA PROBE AMP (~~LOC~~) NOT AT ARMC
Chlamydia: POSITIVE — AB
Neisseria Gonorrhea: NEGATIVE

## 2017-11-13 LAB — RPR: RPR: NONREACTIVE

## 2017-11-14 LAB — URINE CULTURE: CULTURE: NO GROWTH

## 2017-12-19 ENCOUNTER — Other Ambulatory Visit: Payer: Self-pay | Admitting: Internal Medicine

## 2017-12-19 DIAGNOSIS — B20 Human immunodeficiency virus [HIV] disease: Secondary | ICD-10-CM

## 2017-12-19 MED ORDER — EMTRICITABINE-TENOFOVIR AF 200-25 MG PO TABS: 1.0000 | ORAL_TABLET | Freq: Every day | ORAL | 5 refills | Status: DC

## 2017-12-19 MED ORDER — DARUNAVIR-COBICISTAT 800-150 MG PO TABS: ORAL_TABLET | ORAL | 5 refills | Status: DC

## 2018-01-02 ENCOUNTER — Encounter: Payer: Self-pay | Admitting: Internal Medicine

## 2018-01-17 ENCOUNTER — Telehealth (HOSPITAL_COMMUNITY): Payer: Self-pay

## 2018-06-26 ENCOUNTER — Encounter: Payer: Self-pay | Admitting: Internal Medicine

## 2018-07-10 ENCOUNTER — Other Ambulatory Visit: Payer: Self-pay

## 2018-07-10 ENCOUNTER — Ambulatory Visit: Payer: Self-pay

## 2018-07-10 ENCOUNTER — Ambulatory Visit (INDEPENDENT_AMBULATORY_CARE_PROVIDER_SITE_OTHER): Payer: Self-pay | Admitting: Licensed Clinical Social Worker

## 2018-07-10 ENCOUNTER — Other Ambulatory Visit: Payer: Self-pay | Admitting: Behavioral Health

## 2018-07-10 DIAGNOSIS — Z113 Encounter for screening for infections with a predominantly sexual mode of transmission: Secondary | ICD-10-CM

## 2018-07-10 DIAGNOSIS — B20 Human immunodeficiency virus [HIV] disease: Secondary | ICD-10-CM

## 2018-07-10 DIAGNOSIS — F4321 Adjustment disorder with depressed mood: Secondary | ICD-10-CM

## 2018-07-10 DIAGNOSIS — Z79899 Other long term (current) drug therapy: Secondary | ICD-10-CM

## 2018-07-10 NOTE — Progress Notes (Signed)
Integrated Behavioral Health Comprehensive Clinical Assessment  MRN: [id number] Name: Katherine [name]  Session Time: 2:38pm - 3:33pm Total time: 55 minutes  Type of Service: Integrated Behavioral Health-Individual Interpretor: No. Interpretor Name and Language: n/a  PRESENTING CONCERNS: Katherine [name] is a 23 y.o. female accompanied by self. [name] was referred to [facility] clinician for changes in mood after life changes/adjustments.  Previous mental health services Have you ever been treated for a mental health problem? No If "Yes", when were you treated and whom did you see? n/a Have you ever been hospitalized for mental health treatment? No Have you ever been treated for any of the following? Past Psychiatric History/Hospitalization(s): Anxiety: No Bipolar Disorder: No Depression: No Mania: No Psychosis: No Schizophrenia: No Personality Disorder: No Hospitalization for psychiatric illness: No History of Electroconvulsive Shock Therapy: No Prior Suicide Attempts: No Have you ever had thoughts of harming yourself or others or attempted suicide? No plan to harm self or others  Medical history  has a past medical history of HIV (human immunodeficiency virus infection) (HCC) and Tuberculosis. Primary Care Physician: Patient, No Pcp Per Date of last physical exam: 11/12/17 Allergies: No Known Allergies Current medications:  Outpatient Encounter Medications as of 07/10/2018  Medication Sig  . cephALEXin (KEFLEX) 500 MG capsule Take 1 capsule (500 mg total) by mouth 4 (four) times daily.  . darunavir-cobicistat (PREZCOBIX) 800-150 MG tablet TAKE 1 TABLET BY MOUTH DAILY WITH BREAKFAST. TAKE WITH FOOD.  Marland Kitchen doxycycline (VIBRAMYCIN) 100 MG capsule Take 1 capsule (100 mg total) by mouth 2 (two) times daily.  Marland Kitchen emtricitabine-tenofovir AF (DESCOVY) 200-25 MG tablet Take 1 tablet by mouth daily.  . ondansetron (ZOFRAN) 4 MG tablet Take 1 tablet (4 mg  total) by mouth every 8 (eight) hours as needed for nausea or vomiting.  Marland Kitchen TIVICAY 50 MG tablet TAKE 1 TABLET(50 MG) BY MOUTH DAILY   No facility-administered encounter medications on file as of 07/10/2018.    Have you ever had any serious medication reactions? No Is there any history of mental health problems or substance abuse in your family? No Has anyone in your family been hospitalized for mental health treatment? No  Social/family history Who lives in your current household? Patient lives alone but family is close by in town What is your family of origin, childhood history? Born in [place] with perinatal HIV infection and moved to [place] with family at age 58. Reports that she was sick all the time in [place], but virus has been very well controlled in [place].  Are your parents separated or divorced? No What are your social supports? Family, boyfriend, a couple of friends from high school  Education How many grades have you completed? Graduated with cosmetology degree Did you have any problems in school? No; no learning problems but was placed in a lower grade that other kids her age when she moved from [place]  Employment/financial issues Currently works at [employer] and makes enough to pay for current apartment, but wants a better job so she can afford a Building surveyor apartment. Patient also wants to focus on getting into a career in business, as she says that she went to school for what other people wanted her to study/be rather than what she really wanted to do.  Trauma/Abuse history Have you ever experienced or been exposed to any form of abuse? No Have you ever experienced or been exposed to something traumatic? No  Substance use Do you use alcohol, nicotine or caffeine? Caffeine  and some alcohol, socially How old were you when you first tasted alcohol? High school Have you ever used illicit drugs or abused prescription medications? none  Mental status General  appearance/Behavior: Neat Eye contact: Good Motor behavior: Restlestness Speech: Normal Level of consciousness: Alert Mood: Euthymic Affect: Constricted Anxiety level: Low Thought process: Coherent Thought content: WNL Perception: Normal Judgment: Fair Insight: Present  Diagnosis   ICD-10-CM   1. Adjustment disorder with depressed mood F43.21     GOALS ADDRESSED: Patient will Increase healthy adjustment to current life circumstances              INTERVENTIONS: Interventions utilized: Motivational Interviewing, Solution-Focused Strategies and Supportive Counseling   ASSESSMENT/OUTCOME: Patient reports some feelings of sadness, loneliness, and difficulty adjusting to being independent and recent decisions to change her focus in life. The most appropriate diagnosis is Adjustment Disorder with Depressed Mood. Counselor will continue to assess for a standalone mood disorder.   Patient states that she has recently "shut out" her old friends and is unsure whether this was the right decision for her. Counselor guided patient to address why she chose to make changes in her friendships. Patient identified that she wants to focus on moving forward in her life and her friends, who were all about 3 years younger than her, did not understand because they all still live with parents and don't have the responsibilities that she does. Counselor normalized patient's experience of growing past some of her friends, and emphasized that this is a developmentally appropriate place for her to be. Patient shared her belief that her friends have been a distraction for her, and counselor processed with her the meaning of this. She identified that many times she went along with what friends wanted to do, because she did not want to upset them or hurt their feelings. Counselor emphasized the importance of trusting herself to make her own decisions and finding her own happiness. Patient and counselor explored ways  to identify things that would contribute to her happiness. Counselor encouraged patient to reflect on what she wants in various areas of life, and to create a list of these things, to be broken down into action steps toward happiness during next session.   PLAN: Patient may benefit from CBT and/or Reality Therapy sessions every other week.   Scheduled next visit: 07/24/18 @ 11:15am  Reita Cliche Counselor

## 2018-07-11 LAB — T-HELPER CELL (CD4) - (RCID CLINIC ONLY)
CD4 % Helper T Cell: 29 % — ABNORMAL LOW (ref 33–55)
CD4 T CELL ABS: 590 /uL (ref 400–2700)

## 2018-07-11 LAB — URINE CYTOLOGY ANCILLARY ONLY
Chlamydia: NEGATIVE
Neisseria Gonorrhea: NEGATIVE

## 2018-07-12 LAB — HIV-1 RNA QUANT-NO REFLEX-BLD
HIV 1 RNA Quant: <20 copies/mL — AB
HIV-1 RNA QUANT, LOG: DETECTED {Log_copies}/mL — AB

## 2018-07-12 LAB — COMPREHENSIVE METABOLIC PANEL
AG Ratio: 1.4 (calc) (ref 1.0–2.5)
ALT: 10 U/L (ref 6–29)
AST: 14 U/L (ref 10–30)
Albumin: 3.9 g/dL (ref 3.6–5.1)
Alkaline phosphatase (APISO): 84 U/L (ref 31–125)
BILIRUBIN TOTAL: 0.4 mg/dL (ref 0.2–1.2)
BUN: 12 mg/dL (ref 7–25)
CO2: 24 mmol/L (ref 20–32)
Calcium: 9.1 mg/dL (ref 8.6–10.2)
Chloride: 109 mmol/L (ref 98–110)
Creat: 0.74 mg/dL (ref 0.50–1.10)
Globulin: 2.8 g/dL (calc) (ref 1.9–3.7)
Glucose, Bld: 91 mg/dL (ref 65–99)
POTASSIUM: 3.9 mmol/L (ref 3.5–5.3)
Sodium: 139 mmol/L (ref 135–146)
Total Protein: 6.7 g/dL (ref 6.1–8.1)

## 2018-07-12 LAB — CBC WITH DIFFERENTIAL/PLATELET
Absolute Monocytes: 461 cells/uL (ref 200–950)
BASOS ABS: 9 {cells}/uL (ref 0–200)
Basophils Relative: 0.2 %
Eosinophils Absolute: 132 cells/uL (ref 15–500)
Eosinophils Relative: 2.8 %
HCT: 36.7 % (ref 35.0–45.0)
HEMOGLOBIN: 12 g/dL (ref 11.7–15.5)
Lymphs Abs: 2002 cells/uL (ref 850–3900)
MCH: 29.1 pg (ref 27.0–33.0)
MCHC: 32.7 g/dL (ref 32.0–36.0)
MCV: 88.9 fL (ref 80.0–100.0)
MPV: 10.1 fL (ref 7.5–12.5)
Monocytes Relative: 9.8 %
Neutro Abs: 2096 cells/uL (ref 1500–7800)
Neutrophils Relative %: 44.6 %
Platelets: 259 10*3/uL (ref 140–400)
RBC: 4.13 10*6/uL (ref 3.80–5.10)
RDW: 12.6 % (ref 11.0–15.0)
Total Lymphocyte: 42.6 %
WBC: 4.7 10*3/uL (ref 3.8–10.8)

## 2018-07-12 LAB — LIPID PANEL
CHOLESTEROL: 189 mg/dL (ref <200)
HDL: 52 mg/dL (ref >=50)
LDL Cholesterol (Calc): 118 mg/dL (calc) — ABNORMAL HIGH
Non-HDL Cholesterol (Calc): 137 mg/dL (calc) — ABNORMAL HIGH (ref <130)
Total CHOL/HDL Ratio: 3.6 (calc) (ref <5.0)
Triglycerides: 87 mg/dL (ref <150)

## 2018-07-12 LAB — RPR: RPR: NONREACTIVE

## 2018-07-24 ENCOUNTER — Other Ambulatory Visit: Payer: Self-pay

## 2018-07-24 ENCOUNTER — Encounter: Payer: Self-pay | Admitting: Internal Medicine

## 2018-07-24 ENCOUNTER — Telehealth (INDEPENDENT_AMBULATORY_CARE_PROVIDER_SITE_OTHER): Payer: Self-pay | Admitting: Licensed Clinical Social Worker

## 2018-07-24 ENCOUNTER — Ambulatory Visit: Payer: Self-pay | Admitting: Licensed Clinical Social Worker

## 2018-07-24 ENCOUNTER — Ambulatory Visit (INDEPENDENT_AMBULATORY_CARE_PROVIDER_SITE_OTHER): Payer: Self-pay | Admitting: Internal Medicine

## 2018-07-24 DIAGNOSIS — Z5181 Encounter for therapeutic drug level monitoring: Secondary | ICD-10-CM

## 2018-07-24 DIAGNOSIS — Z0000 Encounter for general adult medical examination without abnormal findings: Secondary | ICD-10-CM

## 2018-07-24 DIAGNOSIS — F4321 Adjustment disorder with depressed mood: Secondary | ICD-10-CM

## 2018-07-24 DIAGNOSIS — B20 Human immunodeficiency virus [HIV] disease: Secondary | ICD-10-CM | POA: Insufficient documentation

## 2018-07-24 DIAGNOSIS — Z21 Asymptomatic human immunodeficiency virus [HIV] infection status: Secondary | ICD-10-CM

## 2018-07-24 NOTE — Progress Notes (Signed)
   Subjective:    Patient ID: Katherine [name], female    DOB: Aug 08, 1995, 23 y.o.   MRN: [id number]  HPI Here for follow up of HIV She continues on Tivicay, Descovy and Prezcobix and no missed doses.  CD4 of 590, viral load < 20.  Normal creat, LFTs.  She denies any sexual activity.  No missed doses, no new issues.     Review of Systems  Constitutional: Negative for fatigue and fever.  Gastrointestinal: Negative for diarrhea and nausea.  Skin: Negative for rash.       Objective:   Physical Exam Constitutional:      Appearance: Normal appearance.  HENT:     Mouth/Throat:     Pharynx: No posterior oropharyngeal erythema.  Eyes:     General: No scleral icterus. Cardiovascular:     Rate and Rhythm: Normal rate and regular rhythm.     Heart sounds: No murmur.  Pulmonary:     Breath sounds: Normal breath sounds.  Skin:    Findings: No rash.  Neurological:     Mental Status: She is alert.    SH: no tobacco       Assessment & Plan:

## 2018-07-25 DIAGNOSIS — Z0000 Encounter for general adult medical examination without abnormal findings: Secondary | ICD-10-CM | POA: Insufficient documentation

## 2018-07-25 DIAGNOSIS — Z5181 Encounter for therapeutic drug level monitoring: Secondary | ICD-10-CM | POA: Insufficient documentation

## 2018-07-25 NOTE — Assessment & Plan Note (Signed)
No sexual activity per the patient.  No pap indicated at this time.

## 2018-07-25 NOTE — Assessment & Plan Note (Signed)
She is doing well on her regimen, no changes and she can rtc in 6 months.

## 2018-07-25 NOTE — Assessment & Plan Note (Signed)
No issues with her medication.   Normal creat, LFTs.

## 2018-07-26 NOTE — Progress Notes (Signed)
Integrated Behavioral Health Visit via Telemedicine (Telephone)  07/26/2018 Sherylyn Blick [id number]   Session Start time: 3:02pm  Session End time: 3:34pm Total time: 30 minutes  Type of Visit: Telephonic Patient location: patient's home [facility] Provider location: [facility] All persons participating in visit: counselor and patient  Confirmed patient's address: Yes  Confirmed patient's phone number: Yes  Any changes to demographics: No   Confirmed patient's insurance: Yes  Any changes to patient's insurance: No   Discussed confidentiality: Yes    The following statements were read to the patient and/or legal guardian that are established with the [facility] Provider.  "The purpose of this phone visit is to provide behavioral health care while limiting exposure to the coronavirus (COVID19). "  "By engaging in this telephone visit, you consent to the provision of healthcare.  Additionally, you authorize for your insurance to be billed for the services provided during this telephone visit."   Patient and/or legal guardian consented to telephone visit: Yes   PRESENTING CONCERNS: Patient and/or family reports the following symptoms/concerns: frustration, sad mood, anxiety about the future Duration of problem: ; Severity of problem: moderate  STRENGTHS (Protective Factors/Coping Skills): Supportive family, history of resiliency, well connected to health services, self-motivated  GOALS ADDRESSED: Patient will: 1.  Demonstrate ability to: Increase healthy adjustment to current life circumstances  INTERVENTIONS: Interventions utilized:  Solution-Focused Strategies and Supportive Counseling  ASSESSMENT: Patient currently experiencing low mood, some anxiety, frustration/irritability. She is no longer working (furloughed), but has money saved to get by for 2 months and has applied for emergency unemployment. Counselor explored with patient the anxiety and frustration she feels.  Patient indicates that she is frustrated by the fact that she is motivated to plan for her future (school or career change or moving) but not only can she not do so while in social isolation, but she needs resources to access ways to make a better life and get more resources. Counselor normalized and validated these feelings. Counselor suggested that during this time of social isolation, patient can focus on breaking down her plan into smaller pieces and coming up with as many ways as possible to work toward those small pieces. This way, when things go back to normal she will be prepared to begin working on it. Patient also shared that she is worried things will not go back to normal at all, and she will have to adjust to new ways of life alone. Counselor challenged patient to identify all the people in her life who make her not alone. Counselor encouraged her to reach out to these people and to accept their support.    Patient may benefit from Solution-Focused sessions as needed.   PLAN: 1. Patient will call for follow up session.  [place]

## 2018-12-04 ENCOUNTER — Telehealth: Payer: Self-pay | Admitting: *Deleted

## 2018-12-04 ENCOUNTER — Other Ambulatory Visit: Payer: Self-pay

## 2018-12-04 DIAGNOSIS — Z20822 Contact with and (suspected) exposure to covid-19: Secondary | ICD-10-CM

## 2018-12-04 NOTE — Telephone Encounter (Signed)
Patient mother called to advise she was sick at work and went to [employer] for [employer] testing. She wants to know what else the patient may need to do.   Called the patient to try and get symptoms and a time line but she did not answer. Left a message for her to give the office a call once she was able.

## 2018-12-05 ENCOUNTER — Telehealth: Payer: Self-pay

## 2018-12-05 LAB — NOVEL CORONAVIRUS, NAA: SARS-CoV-2, NAA: NOT DETECTED

## 2018-12-05 NOTE — Telephone Encounter (Signed)
Received phone call from patient's Mother. Katherine [name] states she is currently out of town and will be home this weekend, but was wanting to make Katherine [name] aware that Katherine [name] was tested for Covid (results pending) Per mother patient denies fever or chills, Cough, Shortness of breath or difficulty breathing,  Muscle or body aches, Headache, no loss of taste or smell, Sore throat, Congestion or runny nose, Nausea or vomiting, or Diarrhea. Mother states patient is only feeling very fatigued. Advised her to remain quarantined and if she feels like she need to she can seek care in the ER. Routing to Katherine [name] to make aware.  Eugenia Mcalpine, LPN

## 2019-01-08 ENCOUNTER — Encounter: Payer: Self-pay | Admitting: Internal Medicine

## 2019-02-14 ENCOUNTER — Other Ambulatory Visit: Payer: Self-pay | Admitting: Internal Medicine

## 2019-02-14 DIAGNOSIS — B20 Human immunodeficiency virus [HIV] disease: Secondary | ICD-10-CM

## 2019-03-21 IMAGING — US US ABDOMEN LIMITED
1 series · 14 of 25 positions shown · non-contrast
Comparison: CT abdomen pelvis-earlier same day

CLINICAL DATA: Right-sided abdominal pain for the past week.

EXAM:
ULTRASOUND ABDOMEN LIMITED RIGHT UPPER QUADRANT

[Series 1: us abdomen limited · 14 of 71 slices shown]
[im 1/71]
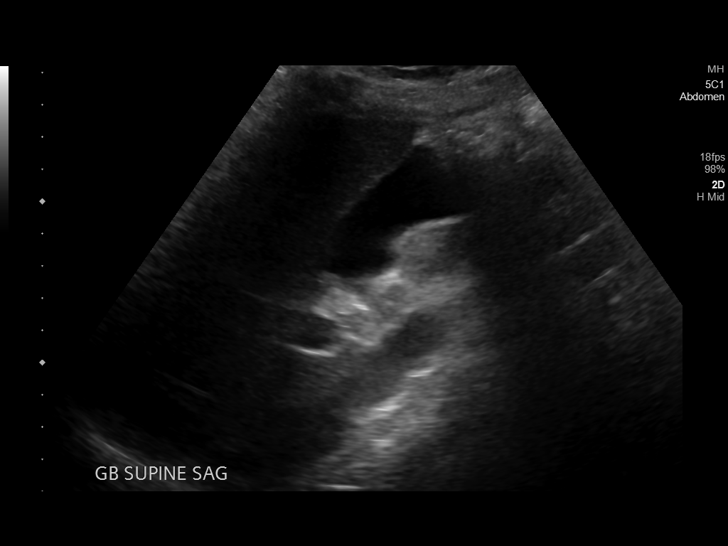
[im 6/71]
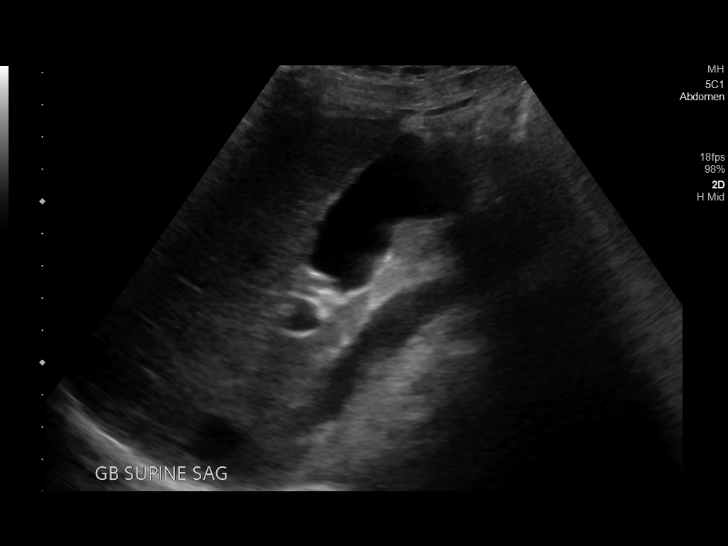
[im 12/71]
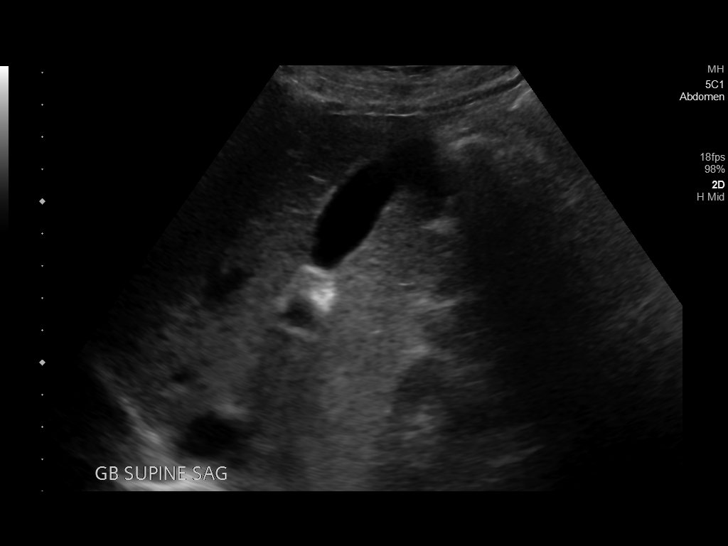
[im 18/71]
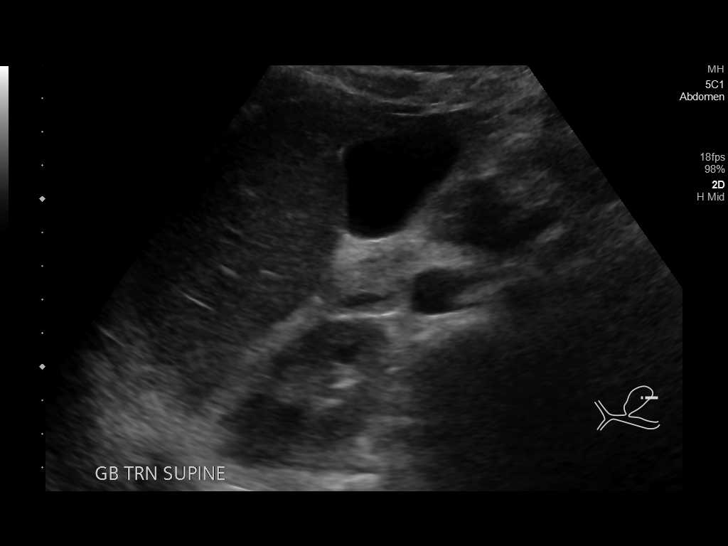
[im 24/71]
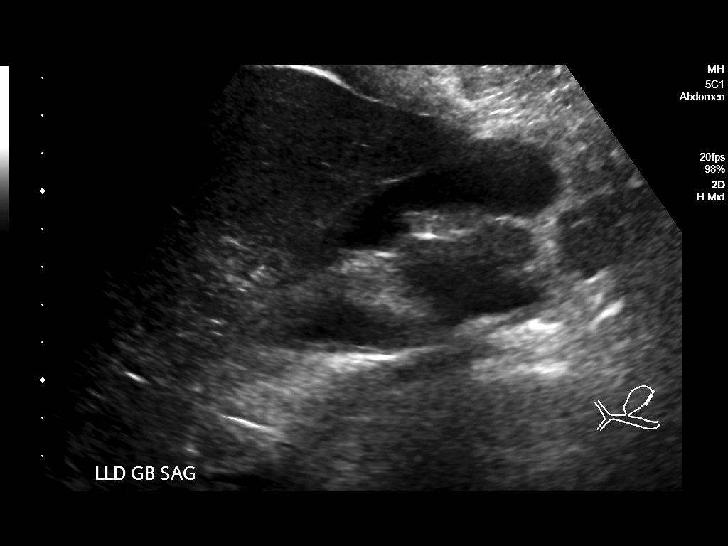
[im 27/71]
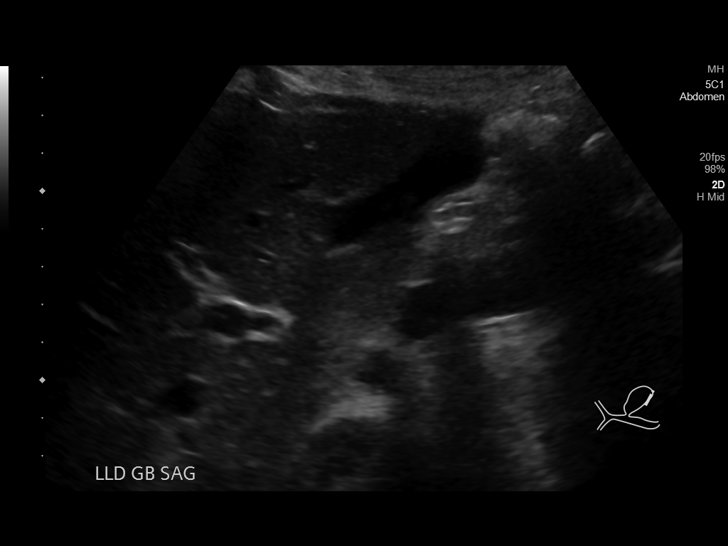
[im 33/71]
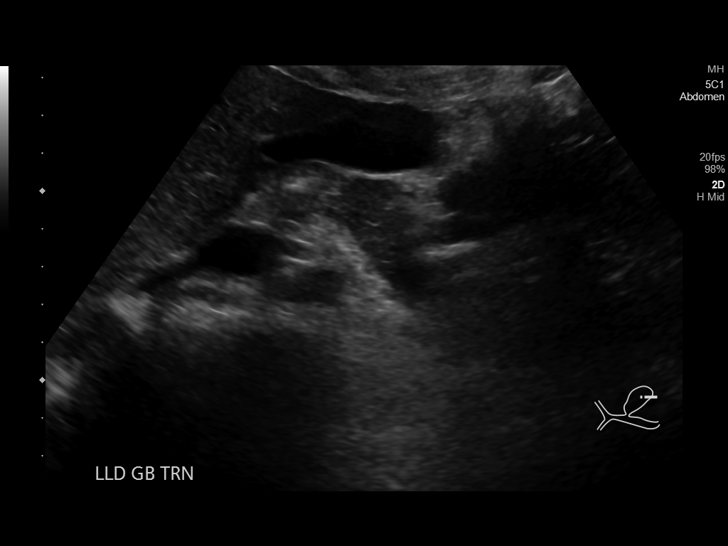
[im 38/71]
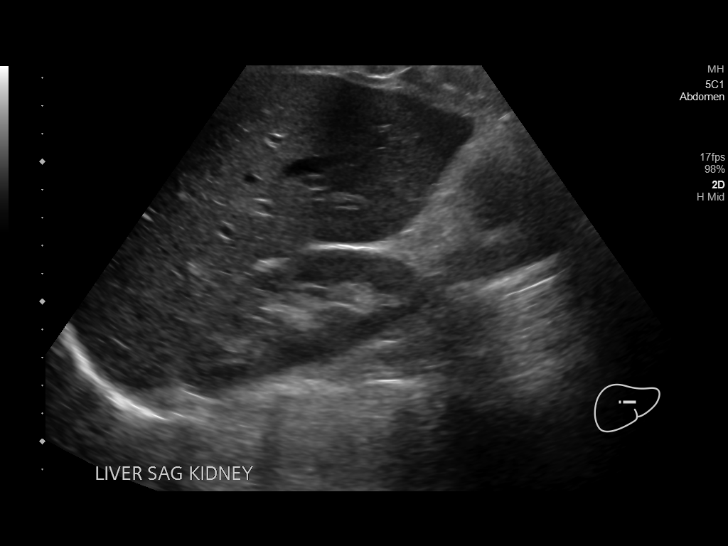
[im 44/71]
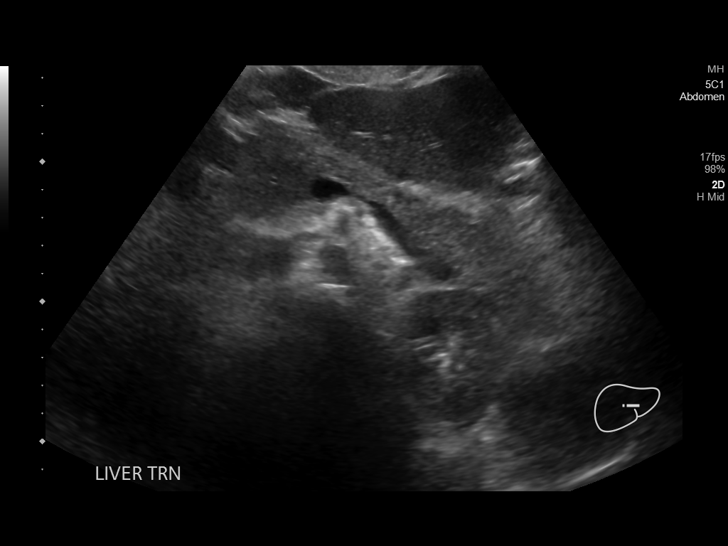
[im 47/71]
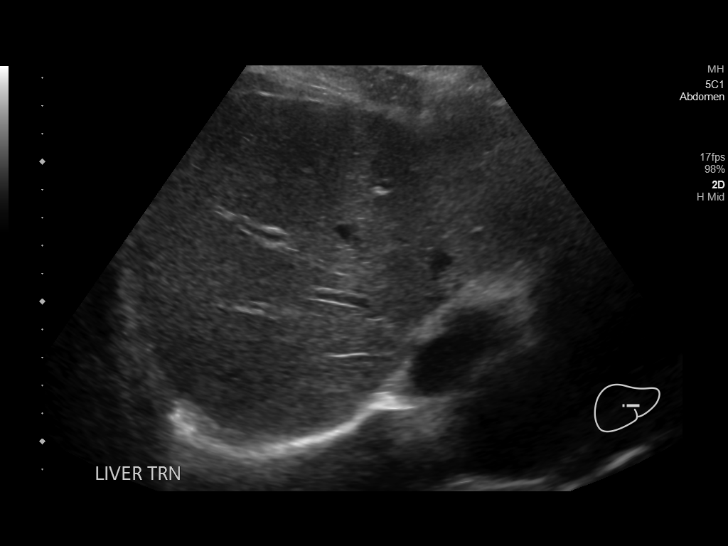
[im 53/71]
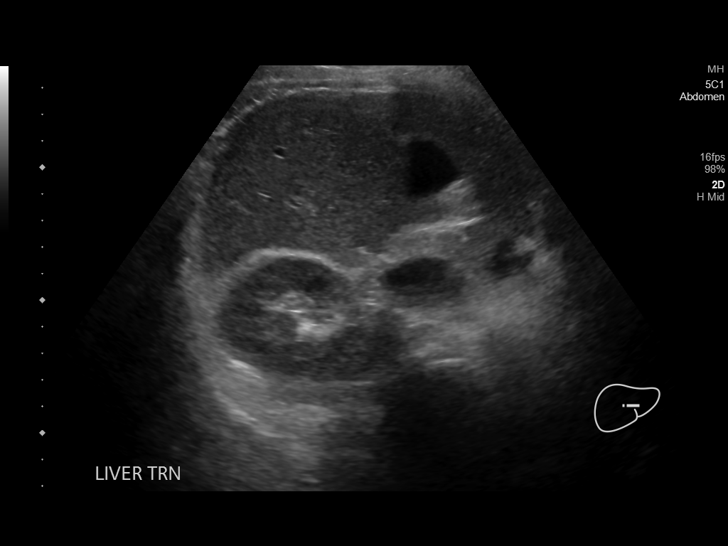
[im 59/71]
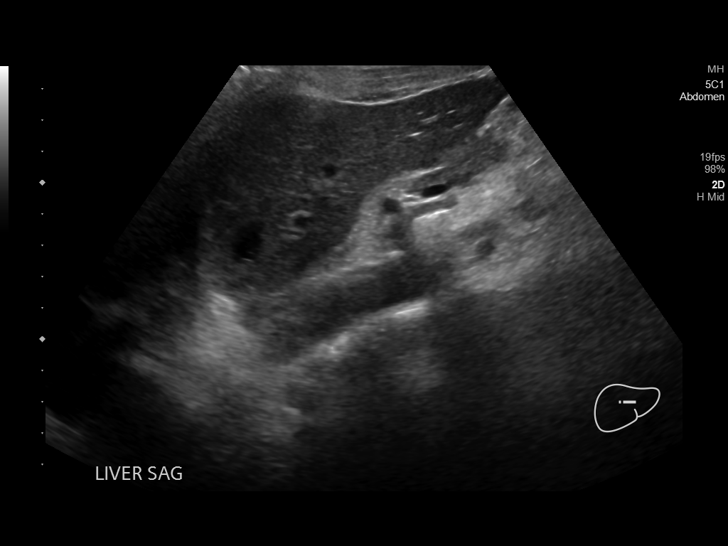
[im 65/71]
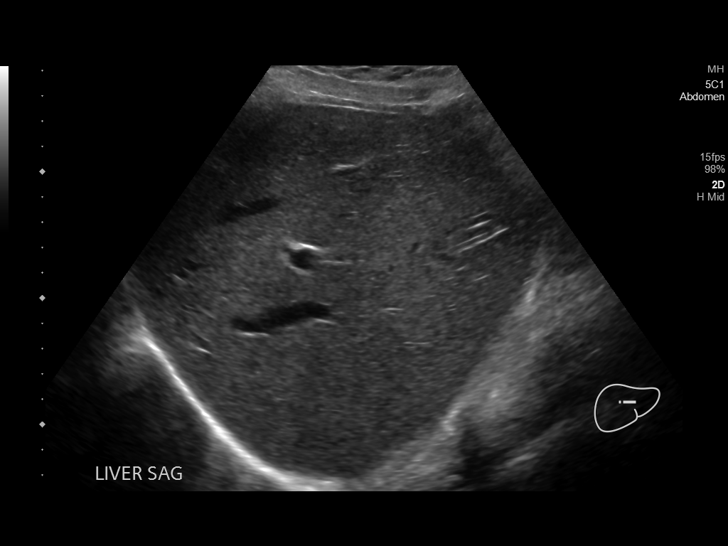
[im 71/71]
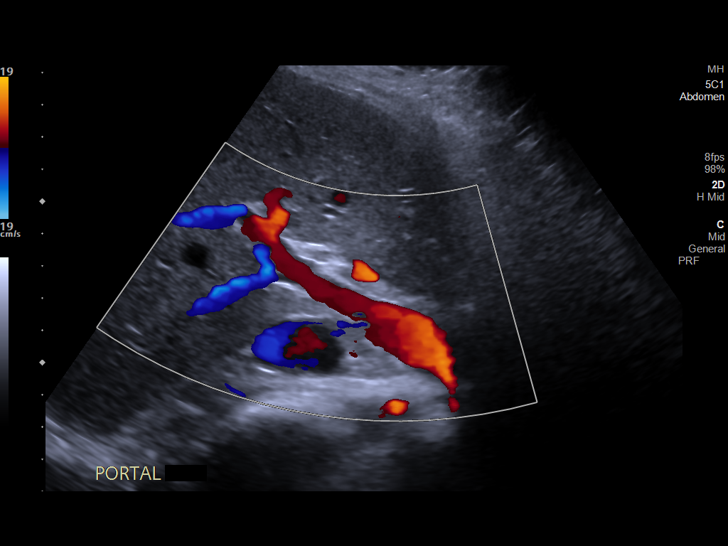

[14 of 25 positions shown; findings below may reference images not displayed]

FINDINGS: Gallbladder:

Normal sonographic appearance of the gallbladder. No echogenic
stones or biliary sludge. No gallbladder wall thickening or
pericholecystic fluid. Negative sonographic Murphy's sign.

Common bile duct:

Diameter: Normal in size measuring 3.6 mm in diameter

Liver:

Normal sonographic appearance of the liver. No discrete hepatic
lesions. No intrahepatic biliary ductal dilatation. No ascites.
Portal vein is patent on color Doppler imaging with normal direction
of blood flow towards the liver.
IMPRESSION: No explanation for patient's right upper quadrant abdominal pain.
Specifically, no evidence of cholelithiasis.

## 2019-03-21 IMAGING — CT CT ABD-PELV W/ CM
2 of 4 series · 16 of 46 positions shown, 18 images · IV contrast (ISOVUE)
Comparison: Abdominal ultrasound dated 11/12/2017

CLINICAL DATA: Abdominal pain for 1 week.  Nausea and vomiting.

EXAM:
CT ABDOMEN AND PELVIS WITH CONTRAST
TECHNIQUE: Multidetector CT imaging of the abdomen and pelvis was performed
using the standard protocol following bolus administration of
intravenous contrast.
CONTRAST:  100mL [id number] IOPAMIDOL ([id number]) INJECTION 61%

[Series 2: axial st · axial · 0.71mm/px · z∈[+1264,+1649]mm · 13 of 87 slices shown, 15 images]
[im 5/87  soft-tissue]
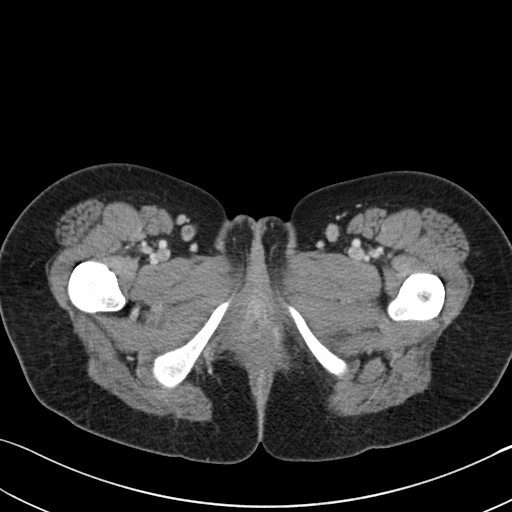
[im 5/87  bone]
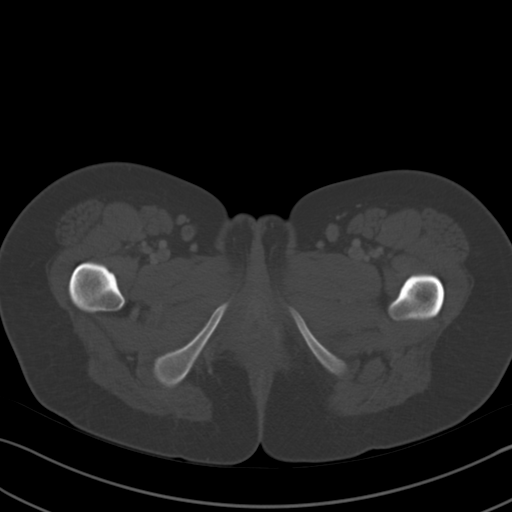
[im 14/87  soft-tissue]
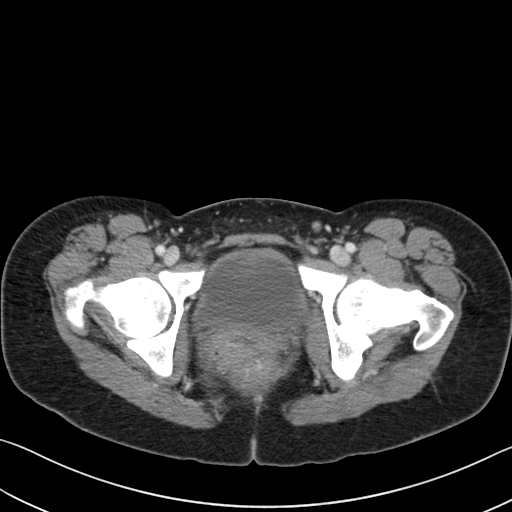
[im 19/87  soft-tissue]
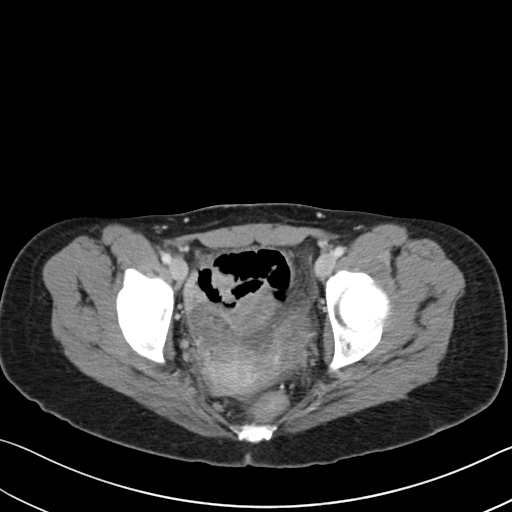
[im 23/87  soft-tissue]
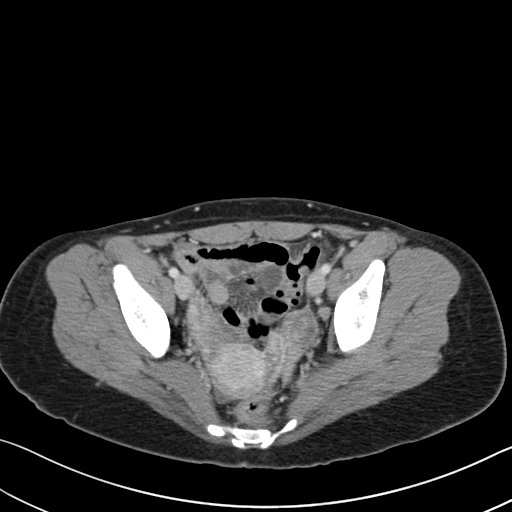
[im 32/87  soft-tissue]
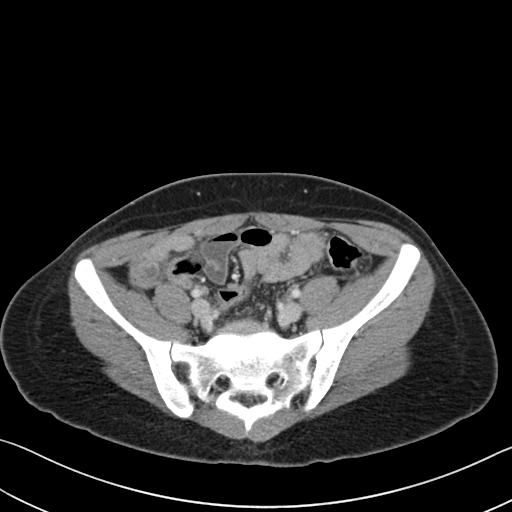
[im 37/87  soft-tissue]
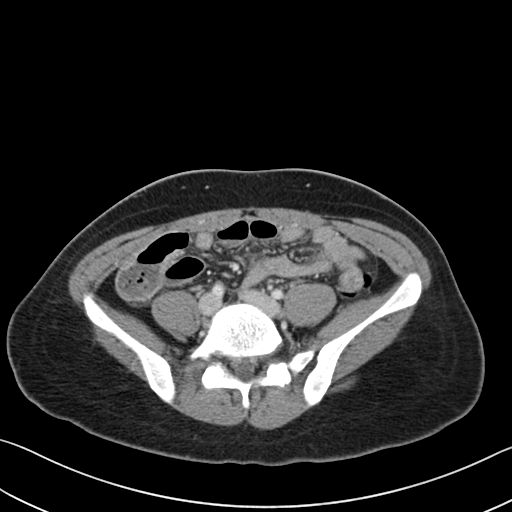
[im 46/87  soft-tissue]
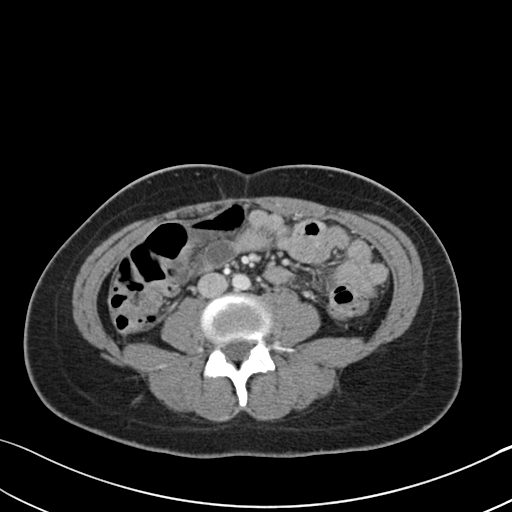
[im 50/87  soft-tissue]
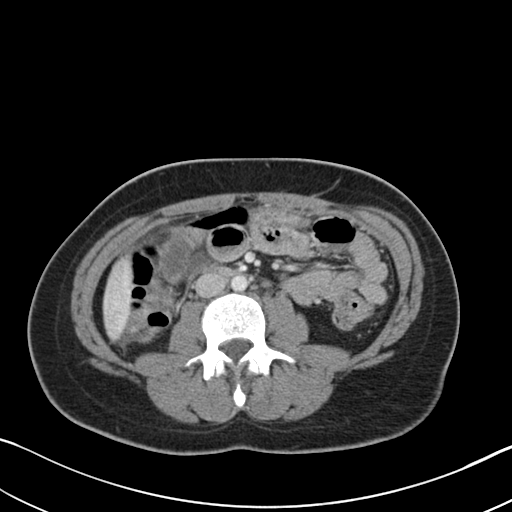
[im 55/87  soft-tissue]
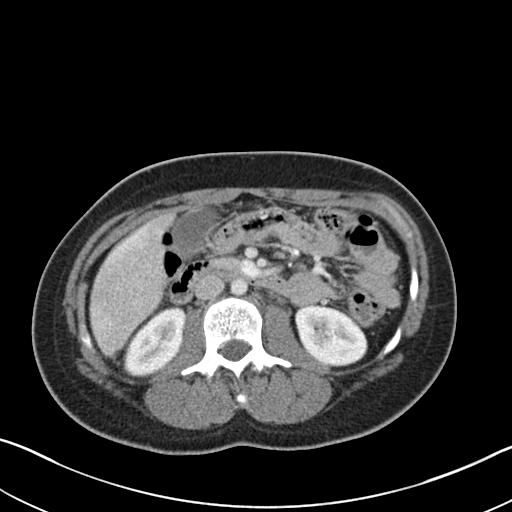
[im 55/87  bone]
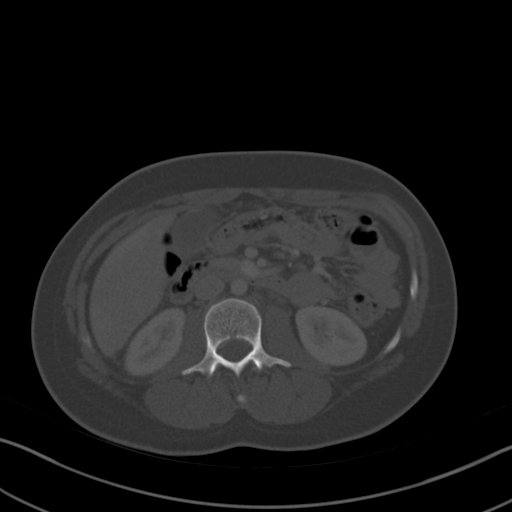
[im 64/87  soft-tissue]
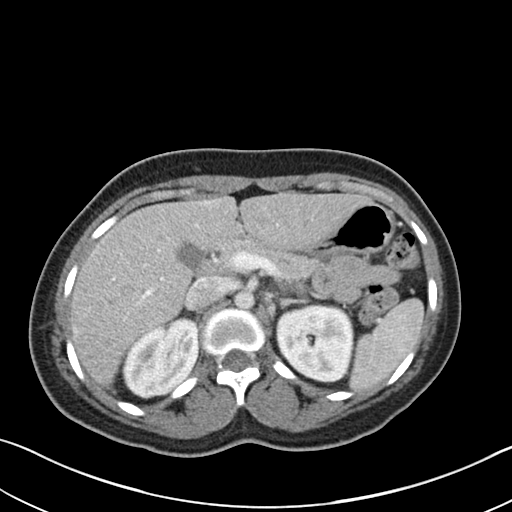
[im 68/87  soft-tissue]
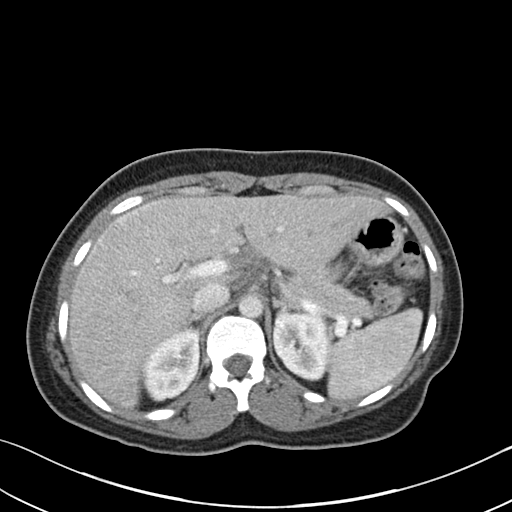
[im 73/87  soft-tissue]
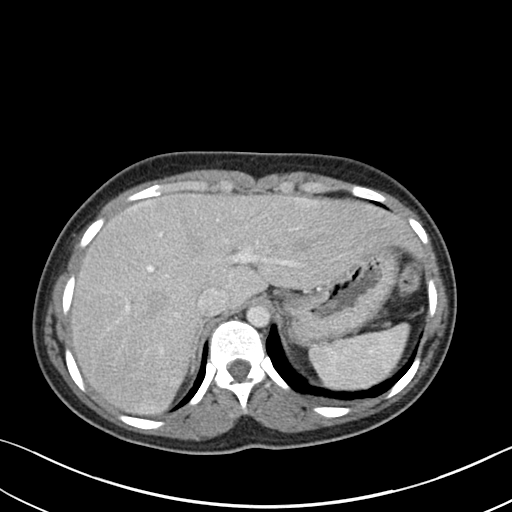
[im 82/87  soft-tissue]
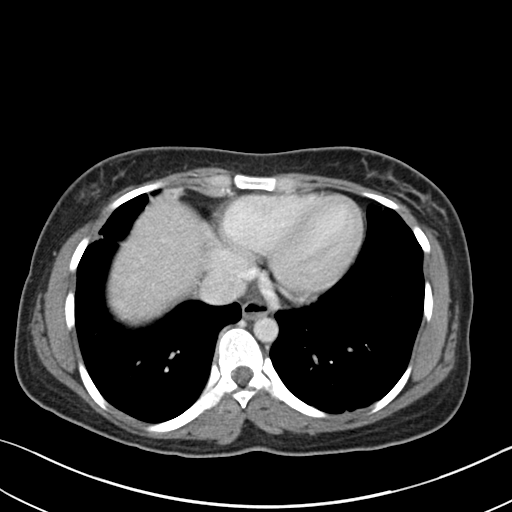

[Series 4: coronal st · coronal · 0.87mm/px · 3 of 65 slices shown]
[im 22/65  soft-tissue]
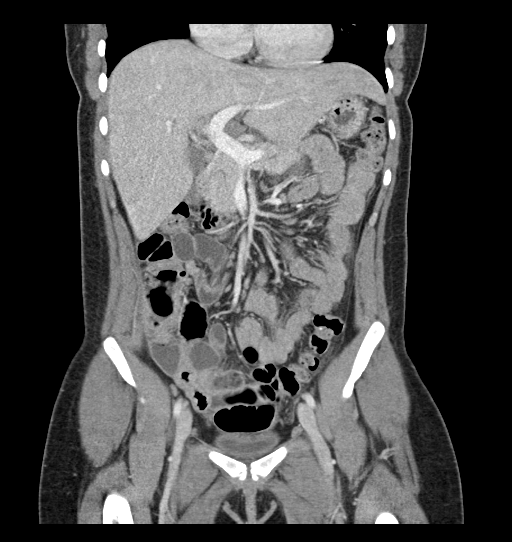
[im 29/65  soft-tissue]
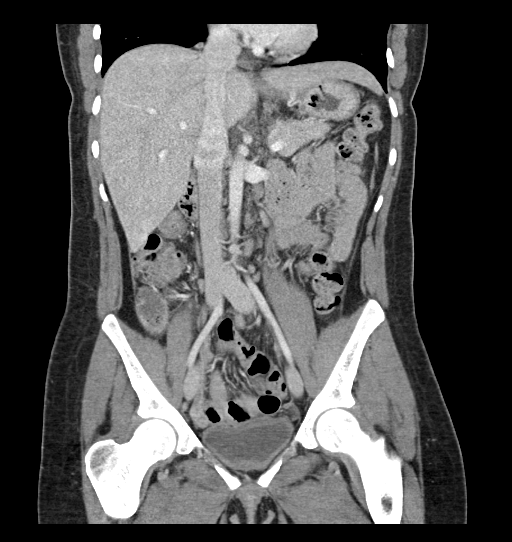
[im 36/65  soft-tissue]
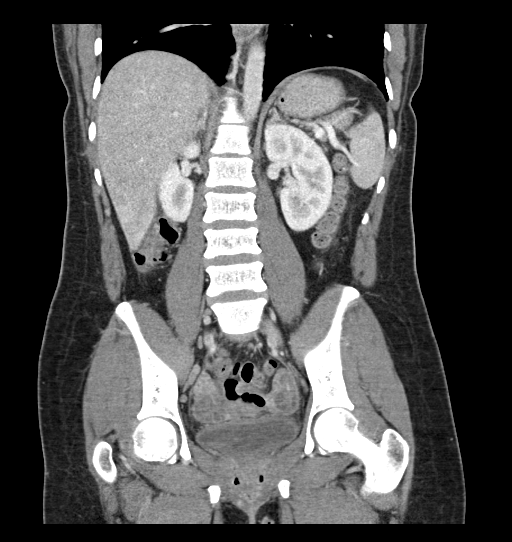

[16 of 46 positions shown; findings below may reference images not displayed]

FINDINGS: Lower chest: [DATE] by 0.5 cm pleural-based nodule in the left lower
lobe on image [DATE], only partially included on today's exam. Adjacent
4 mm pleural-based nodule on image [DATE].

Airway thickening in both lower lobes with mild mosaic attenuation.
Mild atelectasis anteriorly in the right middle lobe. Trace pleural
fluid anteriorly on the right.

Hepatobiliary: Mildly accentuated capsular and subcapsular
enhancement in the liver. Gallbladder unremarkable. Trace
perihepatic ascites.

Pancreas: Unremarkable

Spleen: Unremarkable

Adrenals/Urinary Tract: Unremarkable

Stomach/Bowel: There a normal appearing segment of appendix shown on
images 55-58 of series 2, and accordingly do not have high suspicion
for acute appendicitis.

Vascular/Lymphatic: Unremarkable

Reproductive: The left ovary measures 4.2 by 2.5 by 4.2 cm (volume =
23 cm^3) and the right ovary measures 4.3 by 2.6 by 4.1 cm (volume
= 24 cm^3), both mildly prominent. Both ovaries have mild
prominence of peripheral follicles. There is a small amount of free
pelvic fluid adjacent to the uterus and some indistinctness of the
endometrial stripe. Trace free fluid in the cul-de-sac and probably
anterior to the uterus as well. Localized stranding in the adjacent
pelvic mesentery.

Other: No supplemental non-categorized findings.

Musculoskeletal: Unremarkable
IMPRESSION: 1. Abnormal hepatic capsular and subcapsular enhancement compatible
with perihepatitis. This is most commonly associated with pelvic
inflammatory disease (Fitz-Hugh-Curtis syndrome). With regard to
assessment of the pelvis for pelvic inflammatory disease, there is a
small amount of free pelvic fluid adjacent to the uterus along with
some indistinctness of the endometrial stripe and accentuated
stranding in the mesentery close to the uterus. Correlate with
pelvic exam portion of physical exam.
2. Both ovaries are mildly enlarged and probably have accentuated
peripheral follicles. This can sometimes be seen in the setting of
polycystic ovary disease. Pelvic sonography could be useful in
further assessment. I do not see a tubo-ovarian abscess.
3. Small amount of perihepatic ascites.
4. 2 pleural-based nodules in the left lower lobe, the larger 0.6 cm
in average diameter. In this age group, such nodules are highly
likely to be benign, and do not trigger recommendation for follow
up.
5. Mild atelectasis anteriorly in the right lower lobe with a trace
amount of pleural fluid.

## 2019-04-29 ENCOUNTER — Other Ambulatory Visit: Payer: Self-pay | Admitting: Internal Medicine

## 2019-04-29 DIAGNOSIS — B20 Human immunodeficiency virus [HIV] disease: Secondary | ICD-10-CM

## 2019-04-30 ENCOUNTER — Other Ambulatory Visit: Payer: Self-pay | Admitting: Internal Medicine

## 2019-04-30 DIAGNOSIS — B20 Human immunodeficiency virus [HIV] disease: Secondary | ICD-10-CM

## 2019-05-20 ENCOUNTER — Other Ambulatory Visit: Payer: Self-pay | Admitting: *Deleted

## 2019-05-20 DIAGNOSIS — B20 Human immunodeficiency virus [HIV] disease: Secondary | ICD-10-CM

## 2019-05-20 MED ORDER — PREZCOBIX 800-150 MG PO TABS: ORAL_TABLET | ORAL | 1 refills | Status: DC

## 2019-05-20 MED ORDER — TIVICAY 50 MG PO TABS: ORAL_TABLET | ORAL | 1 refills | Status: DC

## 2019-05-20 MED ORDER — DESCOVY 200-25 MG PO TABS: 1.0000 | ORAL_TABLET | Freq: Every day | ORAL | 1 refills | Status: DC

## 2019-05-23 ENCOUNTER — Telehealth: Payer: Self-pay

## 2019-05-23 NOTE — Telephone Encounter (Signed)
Attempted to call patient to schedule appointment with office. Left voicemail requesting a call back. Lorenso Courier, [place]

## 2019-05-28 ENCOUNTER — Other Ambulatory Visit: Payer: Self-pay

## 2019-05-28 ENCOUNTER — Encounter: Payer: Self-pay | Admitting: Internal Medicine

## 2019-05-28 ENCOUNTER — Ambulatory Visit: Payer: Self-pay

## 2019-05-28 DIAGNOSIS — Z21 Asymptomatic human immunodeficiency virus [HIV] infection status: Secondary | ICD-10-CM

## 2019-05-29 LAB — T-HELPER CELL (CD4) - (RCID CLINIC ONLY)
CD4 % Helper T Cell: 23 % — ABNORMAL LOW (ref 33–65)
CD4 T Cell Abs: 422 /uL (ref 400–1790)

## 2019-05-31 LAB — HIV-1 RNA QUANT-NO REFLEX-BLD
HIV 1 RNA Quant: 185 copies/mL — ABNORMAL HIGH
HIV-1 RNA Quant, Log: 2.27 Log copies/mL — ABNORMAL HIGH

## 2019-06-09 ENCOUNTER — Ambulatory Visit: Payer: Self-pay | Admitting: Internal Medicine

## 2019-06-10 ENCOUNTER — Encounter: Payer: Self-pay | Admitting: Internal Medicine

## 2019-06-10 ENCOUNTER — Other Ambulatory Visit: Payer: Self-pay

## 2019-06-10 ENCOUNTER — Ambulatory Visit (INDEPENDENT_AMBULATORY_CARE_PROVIDER_SITE_OTHER): Payer: Self-pay | Admitting: Internal Medicine

## 2019-06-10 VITALS — BP 118/78 | HR 85 | Temp 98.4°F | Wt 155.0 lb

## 2019-06-10 DIAGNOSIS — Z7185 Encounter for immunization safety counseling: Secondary | ICD-10-CM

## 2019-06-10 DIAGNOSIS — Z7189 Other specified counseling: Secondary | ICD-10-CM

## 2019-06-10 DIAGNOSIS — Z113 Encounter for screening for infections with a predominantly sexual mode of transmission: Secondary | ICD-10-CM

## 2019-06-10 DIAGNOSIS — Z21 Asymptomatic human immunodeficiency virus [HIV] infection status: Secondary | ICD-10-CM

## 2019-06-10 NOTE — Assessment & Plan Note (Signed)
Doing well clinically but her viral load is up from her typical baseline.  I will recheck it today with a genotype as well.  If ok, rtc 6 months.Marland Kitchen

## 2019-06-10 NOTE — Progress Notes (Signed)
   Subjective:    Patient ID: Katherine [name], female    DOB: 10/14/1995, 24 y.o.   MRN: [id number]  HPI Here for follow up of HIV Continues on Biktarvy and no missed doses.  CD4 422 and viral load 185, up from typical.  No missed doses, no concerns.  Looking to move in about 6 months for a change.  No associated n/v/d.     Review of Systems  Constitutional: Negative for fatigue and fever.  Gastrointestinal: Negative for diarrhea and nausea.  Skin: Negative for rash.       Objective:   Physical Exam Constitutional:      Appearance: Normal appearance.  HENT:     Mouth/Throat:     Pharynx: No posterior oropharyngeal erythema.  Eyes:     General: No scleral icterus. Cardiovascular:     Rate and Rhythm: Normal rate and regular rhythm.     Heart sounds: No murmur.  Pulmonary:     Breath sounds: Normal breath sounds.  Skin:    Findings: No rash.  Neurological:     General: No focal deficit present.     Mental Status: She is alert.  Psychiatric:        Mood and Affect: Mood normal.    SH: no tobacco       Assessment & Plan:

## 2019-06-10 NOTE — Assessment & Plan Note (Signed)
I counseled her on the COVID vaccine and gave her information to get signed up

## 2019-07-01 LAB — HIV-1 RNA QUANT-NO REFLEX-BLD
HIV 1 RNA Quant: 90 copies/mL — ABNORMAL HIGH
HIV-1 RNA Quant, Log: 1.95 Log copies/mL — ABNORMAL HIGH

## 2019-07-01 LAB — HIV-1 GENOTYPING (RTI,PI,IN INHBTR): HIV-1 Genotype: DETECTED — AB

## 2019-07-04 ENCOUNTER — Telehealth: Payer: Self-pay | Admitting: Pharmacist

## 2019-07-04 NOTE — Telephone Encounter (Signed)
Cumulative HIV Genotype Data  Genotype Dates: 04/07/16, 06/10/2019  RT Mutations  D67N/D, L210W, T215Y, K219N, M184V, E44E/D, L100I, K103N  PI Mutations   Integrase Mutations    Interpretation of Genotype Data per [employer] HIV Drug Resistance Database:  Nucleoside RTIs  Abacavir - intermediate resistance Zidovudine - high level resistance Emtricitabine - high level resistance Lamivudine - high level resistance Tenofovir - intermediate resistance   Non-Nucleoside RTIs  Doravirine - intermediate resistance Efavirenz - high level resistance Etravirine - intermediate resistance Nevirapine - high level resistance Rilpivirine - high level resistance   Protease Inhibitors  Atazanavir - none Darunavir - none Lopinavir - none   Integrase Inhibitors  Bictegravir - N/A Dolutegravir - N/A Elvitegravir - N/A Raltegravir - N/A    Cassie L. Kuppelweiser, PharmD, BCIDP, AAHIVP, CPP Clinical Pharmacist Practitioner Infectious Diseases Clinical Pharmacist [facility] 07/04/2019, 1:28 PM

## 2019-07-24 ENCOUNTER — Other Ambulatory Visit: Payer: Self-pay

## 2019-07-24 DIAGNOSIS — B20 Human immunodeficiency virus [HIV] disease: Secondary | ICD-10-CM

## 2019-07-24 MED ORDER — PREZCOBIX 800-150 MG PO TABS: ORAL_TABLET | ORAL | 5 refills | Status: DC

## 2019-07-24 MED ORDER — DESCOVY 200-25 MG PO TABS: 1.0000 | ORAL_TABLET | Freq: Every day | ORAL | 5 refills | Status: DC

## 2019-07-24 MED ORDER — TIVICAY 50 MG PO TABS: ORAL_TABLET | ORAL | 5 refills | Status: DC

## 2019-11-25 ENCOUNTER — Other Ambulatory Visit: Payer: Self-pay

## 2019-11-25 DIAGNOSIS — Z113 Encounter for screening for infections with a predominantly sexual mode of transmission: Secondary | ICD-10-CM

## 2019-11-25 DIAGNOSIS — Z21 Asymptomatic human immunodeficiency virus [HIV] infection status: Secondary | ICD-10-CM

## 2019-11-26 LAB — T-HELPER CELL (CD4) - (RCID CLINIC ONLY)
CD4 % Helper T Cell: 25 % — ABNORMAL LOW (ref 33–65)
CD4 T Cell Abs: 471 /uL (ref 400–1790)

## 2019-11-28 LAB — HIV-1 RNA QUANT-NO REFLEX-BLD
HIV 1 RNA Quant: <20 copies/mL — AB
HIV-1 RNA Quant, Log: <1.3 Log copies/mL — AB

## 2019-11-28 LAB — RPR: RPR Ser Ql: NONREACTIVE

## 2019-12-10 ENCOUNTER — Encounter: Payer: Self-pay | Admitting: Internal Medicine

## 2019-12-16 ENCOUNTER — Other Ambulatory Visit: Payer: Self-pay

## 2019-12-16 DIAGNOSIS — B20 Human immunodeficiency virus [HIV] disease: Secondary | ICD-10-CM

## 2019-12-16 MED ORDER — TIVICAY 50 MG PO TABS: ORAL_TABLET | ORAL | 1 refills | Status: DC

## 2019-12-16 MED ORDER — PREZCOBIX 800-150 MG PO TABS: ORAL_TABLET | ORAL | 1 refills | Status: DC

## 2019-12-16 MED ORDER — DESCOVY 200-25 MG PO TABS: 1.0000 | ORAL_TABLET | Freq: Every day | ORAL | 1 refills | Status: DC

## 2020-01-20 ENCOUNTER — Ambulatory Visit: Payer: Self-pay

## 2020-01-20 ENCOUNTER — Other Ambulatory Visit: Payer: Self-pay

## 2020-01-20 ENCOUNTER — Encounter: Payer: Self-pay | Admitting: Internal Medicine

## 2020-02-09 ENCOUNTER — Ambulatory Visit: Payer: Self-pay | Admitting: Internal Medicine

## 2020-02-10 ENCOUNTER — Other Ambulatory Visit: Payer: Self-pay

## 2020-02-10 ENCOUNTER — Encounter: Payer: Self-pay | Admitting: Internal Medicine

## 2020-02-10 ENCOUNTER — Ambulatory Visit: Payer: Self-pay | Admitting: Internal Medicine

## 2020-02-10 VITALS — BP 137/94 | HR 76 | Temp 98.4°F | Ht 62.0 in | Wt 155.0 lb

## 2020-02-10 DIAGNOSIS — B20 Human immunodeficiency virus [HIV] disease: Secondary | ICD-10-CM

## 2020-02-10 DIAGNOSIS — Z7185 Encounter for immunization safety counseling: Secondary | ICD-10-CM

## 2020-02-10 DIAGNOSIS — Z21 Asymptomatic human immunodeficiency virus [HIV] infection status: Secondary | ICD-10-CM

## 2020-02-10 DIAGNOSIS — Z113 Encounter for screening for infections with a predominantly sexual mode of transmission: Secondary | ICD-10-CM

## 2020-02-10 DIAGNOSIS — Z79899 Other long term (current) drug therapy: Secondary | ICD-10-CM | POA: Insufficient documentation

## 2020-02-10 MED ORDER — PREZCOBIX 800-150 MG PO TABS: ORAL_TABLET | ORAL | 11 refills | Status: DC

## 2020-02-10 MED ORDER — DESCOVY 200-25 MG PO TABS: 1.0000 | ORAL_TABLET | Freq: Every day | ORAL | 11 refills | Status: DC

## 2020-02-10 MED ORDER — TIVICAY 50 MG PO TABS: ORAL_TABLET | ORAL | 11 refills | Status: DC

## 2020-02-10 NOTE — Assessment & Plan Note (Signed)
Doing well on her regimen and no concerns now with the viral load < 20 again.  rtc in 6 months.

## 2020-02-10 NOTE — Assessment & Plan Note (Signed)
She had her flu shot and COVID.

## 2020-02-10 NOTE — Progress Notes (Signed)
   Subjective:    Patient ID: Katherine [name], female    DOB: 01/18/1996, 24 y.o.   MRN: [id number]  HPI Here for follow up of HIV She continues on Tivicay, Descovy and Prezcobix and no missed doses.  CD4 471 and viral load < 20 in July.  Missed a follow up appt in August.  Had a detectable viral load to 185 previously but repeat ok and undetectable again now.  She is doing well, working and in a new apartment.  She did get her COVID vaccine.    Review of Systems  Constitutional: Negative for fatigue.  Gastrointestinal: Negative for diarrhea and nausea.  Skin: Negative for rash.       Objective:   Physical Exam Eyes:     General: No scleral icterus. Pulmonary:     Effort: Pulmonary effort is normal.  Neurological:     General: No focal deficit present.     Mental Status: She is alert.  Psychiatric:        Mood and Affect: Mood normal.           Assessment & Plan:

## 2020-06-07 ENCOUNTER — Telehealth: Payer: Self-pay | Admitting: Physician Assistant

## 2020-06-07 ENCOUNTER — Encounter: Payer: Self-pay | Admitting: Physician Assistant

## 2020-06-07 DIAGNOSIS — J029 Acute pharyngitis, unspecified: Secondary | ICD-10-CM

## 2020-06-07 DIAGNOSIS — D849 Immunodeficiency, unspecified: Secondary | ICD-10-CM

## 2020-06-07 MED ORDER — CEFDINIR 300 MG PO CAPS: 300.0000 mg | ORAL_CAPSULE | Freq: Two times a day (BID) | ORAL | 0 refills | Status: DC

## 2020-06-07 NOTE — Patient Instructions (Signed)
Instructions sent to patients MyChart.

## 2020-06-07 NOTE — Progress Notes (Signed)
Katherine [name] are scheduled for a virtual visit with your provider today.    Just as we do with appointments in the office, we must obtain your consent to participate.  Your consent will be active for this visit and any virtual visit you may have with one of our providers in the next 365 days.    If you have a MyChart account, I can also send a copy of this consent to you electronically.  All virtual visits are billed to your insurance company just like a traditional visit in the office.  As this is a virtual visit, video technology does not allow for your provider to perform a traditional examination.  This may limit your provider's ability to fully assess your condition.  If your provider identifies any concerns that need to be evaluated in person or the need to arrange testing such as labs, EKG, etc, we will make arrangements to do so.    Although advances in technology are sophisticated, we cannot ensure that it will always work on either your end or our end.  If the connection with a video visit is poor, we may have to switch to a telephone visit.  With either a video or telephone visit, we are not always able to ensure that we have a secure connection.   I need to obtain your verbal consent now.   Are you willing to proceed with your visit today?   Katherine [name] has provided verbal consent on 06/07/2020 for a virtual visit (video or telephone).   Katherine Climes, PA-C 06/07/2020  11:18 AM      Virtual Visit via Video   I connected with patient on 06/07/20 at 11:30 AM EST by a video enabled telemedicine application and verified that I am speaking with the correct person using two identifiers.  Location patient: Home Location provider: [facility] Home Office Persons participating in the virtual visit: Patient, Provider  I discussed the limitations of evaluation and management by telemedicine and the availability of in person appointments. The patient expressed understanding  and agreed to proceed.  Subjective:   HPI:   Patient presents via [facility] today complaining of 2-1/2 days of mild scratchy throat, acutely worsening over the past 24 hours.  Notes now having severe sore throat and odynophagia.  Denies fever, chills or aches.  Noted a headache yesterday but is gone today.  Has noted some mild sinus pressure but denies sinus pain, ear pain or tooth pain.  Denies any chest congestion, cough, chest pain or shortness of breath.  Denies loss of taste or smell.  Denies any recent travel or sick contact.  Has had both of her Covid vaccines but has not had booster yet.  Had a nurse at work we will get her throat who noted she had significant white patches on her tonsils bilaterally.  Patient is tolerating p.o. fluids.  Has been taking Alka-Seltzer plus to help with symptoms with some mild relief.  ROS:   See pertinent positives and negatives per HPI.  Patient Active Problem List   Diagnosis Date Noted  . Encounter for long-term (current) use of high-risk medication 02/10/2020  . Screening for STD (sexually transmitted disease) 06/10/2019  . Vaccine counseling 06/10/2019  . Medication monitoring encounter 07/25/2018  . Health care maintenance 07/25/2018  . HIV infection (HCC) 07/24/2018  . 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 H/O myopia 12/02/2014  . Developmental delay 12/02/2014  . Perinatal HIV exposure 12/02/2014    Social History   Tobacco  Use  . Smoking status: Never Smoker  . Smokeless tobacco: Never Used  Substance Use Topics  . Alcohol use: No    Alcohol/week: 0.0 standard drinks    Current Outpatient Medications:  .  darunavir-cobicistat (PREZCOBIX) 800-150 MG tablet, TAKE 1 TABLET BY MOUTH DAILY WITH BREAKFAST AND WITH FOOD, Disp: 30 tablet, Rfl: 11 .  dolutegravir (TIVICAY) 50 MG tablet, TAKE 1 TABLET(50 MG) BY MOUTH DAILY, Disp: 30 tablet, Rfl: 11 .  emtricitabine-tenofovir AF (DESCOVY) 200-25 MG tablet, Take 1 tablet by mouth daily.,  Disp: 30 tablet, Rfl: 11  No Known Allergies  Objective:   There were no vitals taken for this visit.  Patient is well-developed, well-nourished in no acute distress.  Resting comfortably at home.  Head is normocephalic, atraumatic.  No labored breathing.  Speech is clear and coherent with logical content.  Patient is alert and oriented at baseline.  Posterior oropharynx with erythema and tonsillar exudate.  Assessment and Plan:   1. Immunocompromised state (HCC) 2. Pharyngitis, unspecified etiology Significant pharyngitis.  Otherwise asymptomatic except for mild nasal congestion.  Discussed with her this could be viral including Covid infection but giving tonsillar exudate and lack of other symptoms, there is also concern for strep throat.  Given her immunocompromise state I think it is reasonable to start antibiotic treatment in case of strep pharyngitis.  Will Rx cefdinir 300 mg twice daily x10 days.  Supportive measures and OTC medications reviewed with patient.  Okay to continue Alka-Seltzer.  Vitamin regimen reviewed.  Giving again her immunocompromise state we will send her for Covid tested just to be overly cautious.  She is to quarantine until results are pending.  If Covid positive she would be a candidate for monoclonal antibody infusion.  Patient currently without PCP but does have infectious disease specialist.  She is to follow-up in for any new or worsening symptoms.  Strict ER precautions reviewed with patient who voiced understanding and agreement with plan.  Work note written.  Detailed instructions also sent to patient's MyChart including instructions on testing.    Katherine [name], [place] 06/07/2020

## 2020-07-06 ENCOUNTER — Ambulatory Visit: Payer: Self-pay

## 2020-07-06 ENCOUNTER — Other Ambulatory Visit: Payer: Self-pay

## 2020-07-27 ENCOUNTER — Other Ambulatory Visit: Payer: Self-pay

## 2020-07-27 DIAGNOSIS — Z79899 Other long term (current) drug therapy: Secondary | ICD-10-CM

## 2020-07-27 DIAGNOSIS — Z113 Encounter for screening for infections with a predominantly sexual mode of transmission: Secondary | ICD-10-CM

## 2020-07-27 DIAGNOSIS — Z21 Asymptomatic human immunodeficiency virus [HIV] infection status: Secondary | ICD-10-CM

## 2020-07-28 LAB — URINE CYTOLOGY ANCILLARY ONLY
Chlamydia: POSITIVE — AB
Comment: NEGATIVE
Comment: NORMAL
Neisseria Gonorrhea: NEGATIVE

## 2020-07-28 LAB — T-HELPER CELL (CD4) - (RCID CLINIC ONLY)
CD4 % Helper T Cell: 31 % — ABNORMAL LOW (ref 33–65)
CD4 T Cell Abs: 454 /uL (ref 400–1790)

## 2020-07-29 ENCOUNTER — Telehealth: Payer: Self-pay

## 2020-07-29 ENCOUNTER — Other Ambulatory Visit: Payer: Self-pay | Admitting: Internal Medicine

## 2020-07-29 LAB — CBC WITH DIFFERENTIAL/PLATELET
Absolute Monocytes: 445 cells/uL (ref 200–950)
Basophils Absolute: 11 cells/uL (ref 0–200)
Basophils Relative: 0.2 %
Eosinophils Absolute: 111 cells/uL (ref 15–500)
Eosinophils Relative: 2.1 %
HCT: 38.4 % (ref 35.0–45.0)
Hemoglobin: 12.1 g/dL (ref 11.7–15.5)
Lymphs Abs: 1553 cells/uL (ref 850–3900)
MCH: 28.5 pg (ref 27.0–33.0)
MCHC: 31.5 g/dL — ABNORMAL LOW (ref 32.0–36.0)
MCV: 90.6 fL (ref 80.0–100.0)
MPV: 10.1 fL (ref 7.5–12.5)
Monocytes Relative: 8.4 %
Neutro Abs: 3180 cells/uL (ref 1500–7800)
Neutrophils Relative %: 60 %
Platelets: 245 10*3/uL (ref 140–400)
RBC: 4.24 10*6/uL (ref 3.80–5.10)
RDW: 12.5 % (ref 11.0–15.0)
Total Lymphocyte: 29.3 %
WBC: 5.3 10*3/uL (ref 3.8–10.8)

## 2020-07-29 LAB — LIPID PANEL
Cholesterol: 159 mg/dL (ref <200)
HDL: 45 mg/dL — ABNORMAL LOW (ref >=50)
LDL Cholesterol (Calc): 97 mg/dL (calc)
Non-HDL Cholesterol (Calc): 114 mg/dL (calc) (ref <130)
Total CHOL/HDL Ratio: 3.5 (calc) (ref <5.0)
Triglycerides: 76 mg/dL (ref <150)

## 2020-07-29 LAB — COMPLETE METABOLIC PANEL WITH GFR
AG Ratio: 1.1 (calc) (ref 1.0–2.5)
ALT: 12 U/L (ref 6–29)
AST: 13 U/L (ref 10–30)
Albumin: 3.5 g/dL — ABNORMAL LOW (ref 3.6–5.1)
Alkaline phosphatase (APISO): 78 U/L (ref 31–125)
BUN: 10 mg/dL (ref 7–25)
CO2: 22 mmol/L (ref 20–32)
Calcium: 8.7 mg/dL (ref 8.6–10.2)
Chloride: 108 mmol/L (ref 98–110)
Creat: 0.69 mg/dL (ref 0.50–1.10)
GFR, Est African American: 141 mL/min/{1.73_m2} (ref >=60)
GFR, Est Non African American: 122 mL/min/{1.73_m2} (ref >=60)
Globulin: 3.2 g/dL (calc) (ref 1.9–3.7)
Glucose, Bld: 84 mg/dL (ref 65–99)
Potassium: 4 mmol/L (ref 3.5–5.3)
Sodium: 138 mmol/L (ref 135–146)
Total Bilirubin: 0.4 mg/dL (ref 0.2–1.2)
Total Protein: 6.7 g/dL (ref 6.1–8.1)

## 2020-07-29 LAB — HIV-1 RNA QUANT-NO REFLEX-BLD
HIV 1 RNA Quant: <20 Copies/mL — ABNORMAL HIGH
HIV-1 RNA Quant, Log: <1.3 Log cps/mL — ABNORMAL HIGH

## 2020-07-29 LAB — RPR: RPR Ser Ql: NONREACTIVE

## 2020-07-29 MED ORDER — AZITHROMYCIN 500 MG PO TABS: 1000.0000 mg | ORAL_TABLET | Freq: Once | ORAL | 0 refills | Status: AC

## 2020-07-29 NOTE — Telephone Encounter (Signed)
Patient made aware of test results and medication sent to pharmacy. Advised patient to abstain from sexual activity for up to 10 days after treatment and to also make partner(s) aware that they can be tested and treated at the local HD. Patient verbalized understanding.  Valarie Cones

## 2020-07-29 NOTE — Telephone Encounter (Signed)
-----   Message from Gardiner Barefoot, MD sent at 07/29/2020  8:54 AM EDT ----- Please let her know her chlamydia is positive and I have sent the treatment in to the pharmacy thanks

## 2020-08-10 ENCOUNTER — Ambulatory Visit: Payer: Self-pay | Admitting: Internal Medicine

## 2020-08-10 ENCOUNTER — Encounter: Payer: Self-pay | Admitting: Internal Medicine

## 2020-08-10 ENCOUNTER — Other Ambulatory Visit: Payer: Self-pay

## 2020-08-10 VITALS — BP 122/74 | HR 78 | Resp 16 | Ht 62.0 in | Wt 168.8 lb

## 2020-08-10 DIAGNOSIS — Z113 Encounter for screening for infections with a predominantly sexual mode of transmission: Secondary | ICD-10-CM

## 2020-08-10 DIAGNOSIS — Z5181 Encounter for therapeutic drug level monitoring: Secondary | ICD-10-CM

## 2020-08-10 DIAGNOSIS — Z0000 Encounter for general adult medical examination without abnormal findings: Secondary | ICD-10-CM

## 2020-08-10 DIAGNOSIS — Z23 Encounter for immunization: Secondary | ICD-10-CM

## 2020-08-10 DIAGNOSIS — Z21 Asymptomatic human immunodeficiency virus [HIV] infection status: Secondary | ICD-10-CM

## 2020-08-10 NOTE — Assessment & Plan Note (Addendum)
I will get her scheduled for PAP smear and sexual health counseling with discussion of her options for birth control if she changes her mind.  Pregnancy risk discussed.  I did emphasize the need for condom use and condoms were provided.

## 2020-08-10 NOTE — Assessment & Plan Note (Signed)
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  She has no plans for pregnancy at this time and I did emphasize that if she does get pregnant, her regimen will need to change.

## 2020-08-10 NOTE — Progress Notes (Signed)
   Subjective:    Patient ID: Katherine [name], female    DOB: [birth year], 25 y.o.   MRN: [id number]  HPI Here for follow up of HIV She continues on Prezcobix, Tivicay and Descovy with a history of a 184v mutation. She has no issues with receiving, taking or tolerating the medication.  She was treated for chlamydia last week with a positive test and reports she has not been sexually active since and her partner has now been treated.  No symptoms now.  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  She has had two mRNA vaccines and asking about a third.     Review of Systems  Constitutional: Negative for unexpected weight change.  Gastrointestinal: Negative for diarrhea and nausea.  Skin: Negative for rash.       Objective:   Physical Exam Eyes:     General: No scleral icterus. Cardiovascular:     Rate and Rhythm: Normal rate and regular rhythm.  Pulmonary:     Effort: Pulmonary effort is normal.  Neurological:     General: No focal deficit present.     Mental Status: She is alert.  Psychiatric:        Mood and Affect: Mood normal.   SH: no tobacco; lives in an apt by herself        Assessment & Plan:

## 2020-08-10 NOTE — Assessment & Plan Note (Signed)
Creat, LFTs wnl.  

## 2020-08-10 NOTE — Assessment & Plan Note (Signed)
Screened and s/p treatment for chlamydia.  No symptoms and partner treated as well.  Condoms provided

## 2020-10-08 ENCOUNTER — Encounter: Payer: Self-pay | Admitting: Student

## 2020-12-22 ENCOUNTER — Ambulatory Visit: Payer: Self-pay

## 2020-12-22 ENCOUNTER — Other Ambulatory Visit: Payer: Self-pay

## 2021-01-14 ENCOUNTER — Other Ambulatory Visit: Payer: Self-pay | Admitting: Internal Medicine

## 2021-01-14 DIAGNOSIS — B20 Human immunodeficiency virus [HIV] disease: Secondary | ICD-10-CM

## 2021-02-15 ENCOUNTER — Other Ambulatory Visit: Payer: Self-pay

## 2021-02-15 ENCOUNTER — Ambulatory Visit: Payer: Self-pay | Admitting: Infectious Diseases

## 2021-02-15 ENCOUNTER — Ambulatory Visit (INDEPENDENT_AMBULATORY_CARE_PROVIDER_SITE_OTHER): Payer: Self-pay

## 2021-02-15 VITALS — BP 113/76 | HR 72 | Resp 16 | Ht 62.0 in | Wt 171.6 lb

## 2021-02-15 DIAGNOSIS — Z7185 Encounter for immunization safety counseling: Secondary | ICD-10-CM

## 2021-02-15 DIAGNOSIS — Z0000 Encounter for general adult medical examination without abnormal findings: Secondary | ICD-10-CM

## 2021-02-15 DIAGNOSIS — Z23 Encounter for immunization: Secondary | ICD-10-CM

## 2021-02-15 DIAGNOSIS — Z21 Asymptomatic human immunodeficiency virus [HIV] infection status: Secondary | ICD-10-CM

## 2021-02-15 NOTE — Patient Instructions (Addendum)
Please continue your current medications.   Stop by the lab on your way out.   We gave you your flu shot and covid vaccine   Talk with your mother about getting a pap smear - these screening tests are recommended every 3 years for women. They can be helpful to prevent cervical cancer if changes are caught early with regular screening.  I am happy to help do this for you - please call to schedule an appointment with me if you would like to do this  Condoms for pregnancy prevention - happy to get you on birth control if you prefer that   Recommended next office visit - 6 months

## 2021-02-15 NOTE — Assessment & Plan Note (Signed)
Counseled on pap smear - she has never had one in the past. She has been sexually active, though not over the last 6 months. Chlamydia treated in January of this year. She does have one female partner she is considering dating now - counseling discussed re: sti and pregnancy prevention with condom use. Offered discussion in the future should she decide on OCPs/injectable contraception.  She will call back to schedule pap smear after she talks with her mother more about this.

## 2021-02-15 NOTE — Assessment & Plan Note (Signed)
Doing well on Tivicay + Prezcobix + Descovy. She is happy with current pill burden and does not want to change anything at this time.  Recommended vaccines provided Recommended pap smear for preventative care  Update labs today - VL / CD4. Declines STI screening.  RTC in 80m. She will decide on pap smear between now and then after talking to her mother more.

## 2021-02-15 NOTE — Progress Notes (Signed)
Name: Katherine [name]  DOB: [birth year] MRN: [id number] PCP: Patient, No Pcp Per (Inactive)    Subjective:   Chief Complaint  Patient presents with   Follow-up      HPI: Perinatally acquired HIV infection under perfect control. She is very happy she does not have as many pills to take now vs previous years. Continues her Prezcobix, Tivicay and Descovy daily with food.   She has never had a pap smear. Has not had sex in over 6 months and currently planning on condoms for pregnancy prevention. One female partner whom she has not been intimate with yet. "Wants to take things slow."    Depression screen [facility] 2/9 02/15/2021  Decreased Interest 0  Down, Depressed, Hopeless 0  PHQ - 2 Score 0    Review of Systems  Constitutional:  Negative for chills, fever, malaise/fatigue and weight loss.  HENT:  Negative for sore throat.        No dental problems  Respiratory:  Negative for cough and sputum production.   Cardiovascular:  Negative for chest pain and leg swelling.  Gastrointestinal:  Negative for abdominal pain, diarrhea and vomiting.  Genitourinary:  Negative for dysuria and flank pain.  Musculoskeletal:  Negative for joint pain, myalgias and neck pain.  Skin:  Negative for rash.  Neurological:  Negative for dizziness, tingling and headaches.  Psychiatric/Behavioral:  Negative for depression and substance abuse. The patient is not nervous/anxious and does not have insomnia.     Past Medical History:  Diagnosis Date   HIV (human immunodeficiency virus infection) (HCC)    Tuberculosis     Outpatient Medications Prior to Visit  Medication Sig Dispense Refill   darunavir-cobicistat (PREZCOBIX) 800-150 MG tablet TAKE 1 TABLET BY MOUTH DAILY WITH BREAKFAST 30 tablet 1   DESCOVY 200-25 MG tablet TAKE 1 TABLET BY MOUTH DAILY 30 tablet 1   TIVICAY 50 MG tablet TAKE 1 TABLET(50 MG) BY MOUTH DAILY 30 tablet 1   cefdinir (OMNICEF) 300 MG capsule Take 1 capsule (300 mg total) by  mouth 2 (two) times daily. (Patient not taking: Reported on 02/15/2021) 20 capsule 0   No facility-administered medications prior to visit.     No Known Allergies  Social History   Tobacco Use   Smoking status: Never   Smokeless tobacco: Never  Substance Use Topics   Alcohol use: No    Alcohol/week: 0.0 standard drinks   Drug use: Never    Family History  Adopted: Yes  Problem Relation Age of Onset   HIV/AIDS Mother     Social History   Substance and Sexual Activity  Sexual Activity Never   Birth control/protection: None   Comment: declined condoms     Objective:   Vitals:   02/15/21 1032  BP: 113/76  Pulse: 72  Resp: 16  SpO2: 97%  Weight: 171 lb 9.6 oz (77.8 kg)  Height: 5\' 2"  (1.575 m)   Body mass index is 31.39 kg/m.   Physical Exam HENT:     Mouth/Throat:     Mouth: No oral lesions.     Dentition: No dental abscesses.  Cardiovascular:     Rate and Rhythm: Normal rate and regular rhythm.     Heart sounds: Normal heart sounds.  Pulmonary:     Effort: Pulmonary effort is normal.     Breath sounds: Normal breath sounds.  Abdominal:     General: There is no distension.     Palpations: Abdomen is soft.  Tenderness: There is no abdominal tenderness.  Musculoskeletal:        General: No tenderness. Normal range of motion.  Lymphadenopathy:     Cervical: No cervical adenopathy.  Skin:    General: Skin is warm and dry.     Findings: No rash.  Neurological:     Mental Status: She is alert and oriented to person, place, and time.  Psychiatric:        Judgment: Judgment normal.     Lab Results Lab Results  Component Value Date   WBC 5.3 07/27/2020   HGB 12.1 07/27/2020   HCT 38.4 07/27/2020   MCV 90.6 07/27/2020   PLT 245 07/27/2020    Lab Results  Component Value Date   CREATININE 0.69 07/27/2020   BUN 10 07/27/2020   NA 138 07/27/2020   K 4.0 07/27/2020   CL 108 07/27/2020   CO2 22 07/27/2020    Lab Results  Component Value  Date   ALT 12 07/27/2020   AST 13 07/27/2020   ALKPHOS 84 11/12/2017   BILITOT 0.4 07/27/2020    Lab Results  Component Value Date   CHOL 159 07/27/2020   HDL 45 (L) 07/27/2020   LDLCALC 97 07/27/2020   TRIG 76 07/27/2020   CHOLHDL 3.5 07/27/2020   HIV 1 RNA Quant  Date Value  07/27/2020 <20 Copies/mL (H)  11/25/2019 <20 DETECTED copies/mL (A)  06/10/2019 90 copies/mL (H)   CD4 T Cell Abs (/uL)  Date Value  07/27/2020 454  11/25/2019 471  05/28/2019 422     Assessment & Plan:   Problem List Items Addressed This Visit       Unprioritized   Vaccine counseling    Flu and COVID bivalent counseling discussed today.       HIV infection (HCC)    Doing well on Tivicay + Prezcobix + Descovy. She is happy with current pill burden and does not want to change anything at this time.  Recommended vaccines provided Recommended pap smear for preventative care  Update labs today - VL / CD4. Declines STI screening.  RTC in 58m. She will decide on pap smear between now and then after talking to her mother more.       Health care maintenance    Counseled on pap smear - she has never had one in the past. She has been sexually active, though not over the last 6 months. Chlamydia treated in January of this year. She does have one female partner she is considering dating now - counseling discussed re: sti and pregnancy prevention with condom use. Offered discussion in the future should she decide on OCPs/injectable contraception.  She will call back to schedule pap smear after she talks with her mother more about this.        Rexene Alberts, MSN, NP-C [facility] Infectious Disease [facility] Pager: [phone] Office: [phone]  02/15/21  10:56 AM

## 2021-02-15 NOTE — Assessment & Plan Note (Signed)
Flu and COVID bivalent counseling discussed today.

## 2021-02-16 LAB — T-HELPER CELL (CD4) - (RCID CLINIC ONLY)
CD4 % Helper T Cell: 27 % — ABNORMAL LOW (ref 33–65)
CD4 T Cell Abs: 486 /uL (ref 400–1790)

## 2021-02-17 LAB — HIV-1 RNA QUANT-NO REFLEX-BLD
HIV 1 RNA Quant: <20 Copies/mL — ABNORMAL HIGH
HIV-1 RNA Quant, Log: <1.3 Log cps/mL — ABNORMAL HIGH

## 2021-06-03 ENCOUNTER — Other Ambulatory Visit: Payer: Self-pay | Admitting: Internal Medicine

## 2021-06-03 DIAGNOSIS — B20 Human immunodeficiency virus [HIV] disease: Secondary | ICD-10-CM

## 2021-08-09 ENCOUNTER — Ambulatory Visit: Payer: Self-pay | Admitting: Infectious Diseases

## 2021-08-17 ENCOUNTER — Ambulatory Visit: Payer: Self-pay | Admitting: Infectious Diseases

## 2021-09-07 ENCOUNTER — Ambulatory Visit: Payer: Self-pay | Admitting: Infectious Diseases

## 2021-09-08 ENCOUNTER — Encounter: Payer: Self-pay | Admitting: Infectious Diseases

## 2021-09-08 ENCOUNTER — Ambulatory Visit: Payer: Self-pay | Admitting: Infectious Diseases

## 2021-09-08 ENCOUNTER — Other Ambulatory Visit: Payer: Self-pay

## 2021-09-08 DIAGNOSIS — B20 Human immunodeficiency virus [HIV] disease: Secondary | ICD-10-CM

## 2021-09-08 DIAGNOSIS — Z21 Asymptomatic human immunodeficiency virus [HIV] infection status: Secondary | ICD-10-CM

## 2021-09-08 MED ORDER — TIVICAY 50 MG PO TABS: ORAL_TABLET | ORAL | 11 refills | Status: DC

## 2021-09-08 MED ORDER — SYMTUZA 800-150-200-10 MG PO TABS: 1.0000 | ORAL_TABLET | Freq: Every day | ORAL | 11 refills | Status: DC

## 2021-09-08 NOTE — Progress Notes (Signed)
? ?Name: Katherine [name]  ?DOB: Dec 16, 1995 ?MRN: [id number] ?PCP: Patient, No Pcp Per (Inactive)  ? ? ?Subjective:  ? ?Chief Complaint  ?Patient presents with  ? Follow-up  ?  Needs refills   ?  ? ?Narrative:  ?Katherine [name] is a 26 y.o. female with well controlled HIV disease. Perinatally acquired.  ?She is from [place].  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  ? ? ? ?HPI: ?Perinatally acquired HIV infection under perfect control. She is very happy she does not have as many pills to take now vs previous years. Continues her Prezcobix, Tivicay and Descovy daily with food.  ?She has not missed any doses but does need some refills.  ? ?We spent time catching up. She has never had a pap smear in the past.  ? ? ? ?  09/08/2021  ?  3:41 PM  ?Depression screen PHQ 2/9  ?Decreased Interest 0  ?Down, Depressed, Hopeless 0  ?PHQ - 2 Score 0  ? ? ?Review of Systems  ?Constitutional:  Negative for chills, fever, malaise/fatigue and weight loss.  ?HENT:  Negative for sore throat.   ?     No dental problems  ?Respiratory:  Negative for cough and sputum production.   ?Cardiovascular:  Negative for chest pain and leg swelling.  ?Gastrointestinal:  Negative for abdominal pain, diarrhea and vomiting.  ?Genitourinary:  Negative for dysuria and flank pain.  ?Musculoskeletal:  Negative for joint pain, myalgias and neck pain.  ?Skin:  Negative for rash.  ?Neurological:  Negative for dizziness, tingling and headaches.  ?Psychiatric/Behavioral:  Negative for depression and substance abuse. The patient is not nervous/anxious and does not have insomnia.   ? ? ?Past Medical History:  ?Diagnosis Date  ? HIV (human immunodeficiency virus infection) (HCC)   ? Tuberculosis   ? ? ?Outpatient Medications Prior to Visit  ?Medication Sig Dispense Refill  ? DESCOVY 200-25 MG tablet TAKE 1 TABLET BY MOUTH DAILY 30 tablet 1  ? PREZCOBIX 800-150 MG tablet TAKE 1 TABLET BY MOUTH DAILY WITH BREAKFAST 30 tablet 1  ? TIVICAY 50 MG  tablet TAKE 1 TABLET(50 MG) BY MOUTH DAILY 30 tablet 1  ? cefdinir (OMNICEF) 300 MG capsule Take 1 capsule (300 mg total) by mouth 2 (two) times daily. (Patient not taking: Reported on 02/15/2021) 20 capsule 0  ? ?No facility-administered medications prior to visit.  ?  ? ?No Known Allergies ? ?Social History  ? ?Tobacco Use  ? Smoking status: Never  ? Smokeless tobacco: Never  ?Substance Use Topics  ? Alcohol use: No  ?  Alcohol/week: 0.0 standard drinks  ? Drug use: Never  ? ? ?Family History  ?Adopted: Yes  ?Problem Relation Age of Onset  ? HIV/AIDS Mother   ? ? ?Social History  ? ?Substance and Sexual Activity  ?Sexual Activity Never  ? Birth control/protection: None  ? Comment: declined condoms  ? ? ? ?Objective:  ? ?Vitals:  ? 09/08/21 1540  ?BP: 117/79  ?Pulse: 82  ?Temp: (!) 97.3 ?F (36.3 ?C)  ?TempSrc: Oral  ?SpO2: 96%  ?Weight: 176 lb (79.8 kg)  ?Height: 5\' 2"  (1.575 m)  ? ?Body mass index is 32.19 kg/m?. ? ? ?Physical Exam ?HENT:  ?   Mouth/Throat:  ?   Mouth: No oral lesions.  ?   Dentition: No dental abscesses.  ?Cardiovascular:  ?   Rate and Rhythm: Normal rate and regular rhythm.  ?   Heart sounds: Normal heart sounds.  ?Pulmonary:  ?  Effort: Pulmonary effort is normal.  ?   Breath sounds: Normal breath sounds.  ?Abdominal:  ?   General: There is no distension.  ?   Palpations: Abdomen is soft.  ?   Tenderness: There is no abdominal tenderness.  ?Musculoskeletal:     ?   General: No tenderness. Normal range of motion.  ?Lymphadenopathy:  ?   Cervical: No cervical adenopathy.  ?Skin: ?   General: Skin is warm and dry.  ?   Findings: No rash.  ?Neurological:  ?   Mental Status: She is alert and oriented to person, place, and time.  ?Psychiatric:     ?   Judgment: Judgment normal.  ? ? ? ?Lab Results ?Lab Results  ?Component Value Date  ? WBC 5.3 07/27/2020  ? HGB 12.1 07/27/2020  ? HCT 38.4 07/27/2020  ? MCV 90.6 07/27/2020  ? PLT 245 07/27/2020  ?  ?Lab Results  ?Component Value Date  ? CREATININE  0.69 07/27/2020  ? BUN 10 07/27/2020  ? NA 138 07/27/2020  ? K 4.0 07/27/2020  ? CL 108 07/27/2020  ? CO2 22 07/27/2020  ?  ?Lab Results  ?Component Value Date  ? ALT 12 07/27/2020  ? AST 13 07/27/2020  ? ALKPHOS 84 11/12/2017  ? BILITOT 0.4 07/27/2020  ?  ?Lab Results  ?Component Value Date  ? CHOL 159 07/27/2020  ? HDL 45 (L) 07/27/2020  ? LDLCALC 97 07/27/2020  ? TRIG 76 07/27/2020  ? CHOLHDL 3.5 07/27/2020  ? ?HIV 1 RNA Quant  ?Date Value  ?02/15/2021 <20 Copies/mL (H)  ?07/27/2020 <20 Copies/mL (H)  ?11/25/2019 <20 DETECTED copies/mL (A)  ? ?CD4 T Cell Abs (/uL)  ?Date Value  ?02/15/2021 486  ?07/27/2020 454  ?11/25/2019 471  ? ? ? ?Assessment & Plan:  ? ?Problem List Items Addressed This Visit   ? ?  ? Unprioritized  ? HIV infection (HCC)  ?  Katherine [name] is doing very well. We decided to consolidate some of her pills today and will continue Symtuza + Tivicay taken together once daily with food. She was counseled to continue her current medications until new shipment of medication arrives.  ?Referred to pill chart with what her new pill combination will look like. She was very happy to have to take only 2 pills once a day going forward.  ?RPR, VL, CD4, CBC and CMP today for therapeutic monitoring.  ?Vaccines UTD for PLWH.  ?We spent time discussing cervical cancer screening and recommend she come back for pap smear. She is not currently sexually active. Hopes to have children one day but for now will continue abstinence/condoms for contraception.  ? ?  ?  ? Relevant Medications  ? dolutegravir (TIVICAY) 50 MG tablet  ? Darunavir-Cobicistat-Emtricitabine-Tenofovir Alafenamide (SYMTUZA) 800-150-200-10 MG TABS  ? ?Other Visit Diagnoses   ? ? HIV disease (HCC)      ? Relevant Medications  ? dolutegravir (TIVICAY) 50 MG tablet  ? Darunavir-Cobicistat-Emtricitabine-Tenofovir Alafenamide (SYMTUZA) 800-150-200-10 MG TABS  ? Other Relevant Orders  ? COMPLETE METABOLIC PANEL WITH GFR  ? CBC  ? T-helper cells (CD4) count (not  at [facility])  ? HIV-1 RNA quant-no reflex-bld  ? RPR  ? ?  ? ? ?Rexene Alberts, MSN, NP-C ?[facility] ?[facility] ?Pager: [phone] ?Office: [phone] ? ?09/08/21  ?4:32 PM ? ? ?

## 2021-09-08 NOTE — Progress Notes (Deleted)
? ?Name: Janee Ureste  ?DOB: [birth year] ?MRN: [id number] ?PCP: Patient, No Pcp Per (Inactive)  ? ? ?Patient Active Problem List  ? Diagnosis Date Noted  ? Encounter for long-term (current) use of high-risk medication 02/10/2020  ? Screening for STD (sexually transmitted disease) 06/10/2019  ? Vaccine counseling 06/10/2019  ? Medication monitoring encounter 07/25/2018  ? Health care maintenance 07/25/2018  ? HIV infection ([place]) 07/24/2018  ? 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 H/O myopia 12/02/2014  ? Developmental delay 12/02/2014  ? Perinatal HIV exposure 12/02/2014  ?  ? ?Brief Narrative:  ?*** is a 26 y.o. *** with HIV disease, Dx ***. Mar[place] Kitchen ?CD4 nadir *** ?VL *** ?HIV Risk: *** ?History of OIs: *** ?Intake Labs ***: ?Hep B sAg (***), sAb (***), cAb (***); Hep A (***), Hep C (***) ?Quantiferon (***) ?HLA B*5701 (***) ?G6PD: (***) ? ? ?Previous Regimens: ?*** ? ?Genotypes: ?*** ? ?Subjective:  ?No chief complaint on file. ?  ? ?HPI: ?*** ? ? ?  02/15/2021  ? 10:37 AM  ?Depression screen PHQ 2/9  ?Decreased Interest 0  ?Down, Depressed, Hopeless 0  ?PHQ - 2 Score 0  ? ? ?Health Maintenance = Receives annual preventative care through {his/her} PCP and is up to date on all recommended screenings and vaccinations. {He/She} denies cigarette use, excessive alcohol use and other substance abuse. {He/She} is seeing a dentist regularly and no dental complaints today.  ? ?ROS ? ?Past Medical History:  ?Diagnosis Date  ? HIV (human immunodeficiency virus infection) ([place])   ? Tuberculosis   ? ? ?Outpatient Medications Prior to Visit  ?Medication Sig Dispense Refill  ? cefdinir (OMNICEF) 300 MG capsule Take 1 capsule (300 mg total) by mouth 2 (two) times daily. (Patient not taking: Reported on 02/15/2021) 20 capsule 0  ? DESCOVY 200-25 MG tablet TAKE 1 TABLET BY MOUTH DAILY 30 tablet 1  ? PREZCOBIX 800-150 MG tablet TAKE 1 TABLET BY MOUTH DAILY WITH BREAKFAST 30 tablet 1  ? TIVICAY 50 MG tablet TAKE 1 TABLET(50 MG) BY MOUTH  DAILY 30 tablet 1  ? ?No facility-administered medications prior to visit.  ?  ? ?No Known Allergies ? ?Social History  ? ?Tobacco Use  ? Smoking status: Never  ? Smokeless tobacco: Never  ?Substance Use Topics  ? Alcohol use: No  ?  Alcohol/week: 0.0 standard drinks  ? Drug use: Never  ? ? ?Family History  ?Adopted: Yes  ?Problem Relation Age of Onset  ? HIV/AIDS Mother   ? ? ?Social History  ? ?Substance and Sexual Activity  ?Sexual Activity Never  ? Birth control/protection: None  ? Comment: declined condoms  ? ? ? ?Objective:  ?There were no vitals filed for this visit. ?There is no height or weight on file to calculate BMI. ? ?Physical Exam ? ?Lab Results ?Lab Results  ?Component Value Date  ? WBC 5.3 07/27/2020  ? HGB 12.1 07/27/2020  ? HCT 38.4 07/27/2020  ? MCV 90.6 07/27/2020  ? PLT 245 07/27/2020  ?  ?Lab Results  ?Component Value Date  ? CREATININE 0.69 07/27/2020  ? BUN 10 07/27/2020  ? NA 138 07/27/2020  ? K 4.0 07/27/2020  ? CL 108 07/27/2020  ? CO2 22 07/27/2020  ?  ?Lab Results  ?Component Value Date  ? ALT 12 07/27/2020  ? AST 13 07/27/2020  ? ALKPHOS 84 11/12/2017  ? BILITOT 0.4 07/27/2020  ?  ?Lab Results  ?Component Value Date  ? CHOL 159 07/27/2020  ? HDL 45 (  L) 07/27/2020  ? [place] 97 07/27/2020  ? TRIG 76 07/27/2020  ? CHOLHDL 3.5 07/27/2020  ? ?HIV 1 RNA Quant  ?Date Value  ?02/15/2021 <20 Copies/mL (H)  ?07/27/2020 <20 Copies/mL (H)  ?11/25/2019 <20 DETECTED copies/mL (A)  ? ?CD4 T Cell Abs (/uL)  ?Date Value  ?02/15/2021 486  ?07/27/2020 454  ?11/25/2019 471  ? ? ? ?Assessment & Plan:  ? ?Problem List Items Addressed This Visit   ?None ? ? ?Janene Madeira, MSN, NP-C ?[facility] ?Pager: [phone] ?Office: [phone] ? ?09/08/21  ?3:38 PM ? ? ?

## 2021-09-08 NOTE — Assessment & Plan Note (Signed)
Katherine [name] is doing very well. We decided to consolidate some of her pills today and will continue [place] taken together once daily with food. She was counseled to continue her current medications until new shipment of medication arrives.  ?Referred to pill chart with what her new pill combination will look like. She was very happy to have to take only 2 pills once a day going forward.  ?RPR, VL, CD4, CBC and CMP today for therapeutic monitoring.  ?Vaccines UTD for [facility]WH.  ?We spent time discussing cervical cancer screening and recommend she come back for pap smear. She is not currently sexually active. Hopes to have children one day but for now will continue abstinence/condoms for contraception.  ?

## 2021-09-08 NOTE — Patient Instructions (Addendum)
Please schedule a pap smear at your ability over the summer - this is a screening test to make sure you don't have a risk for developing cervical cancer.  ? ?Please continue your current medications until you get your new pills.  ? ?You will now only need TWO pills taken TOGETHER once a day with food. SYMTUZA and TIVICAY are the names. Both are yellow pills.  ? ?Stop by the lab on your way out.  ?

## 2021-09-09 LAB — T-HELPER CELLS (CD4) COUNT (NOT AT ARMC)
CD4 % Helper T Cell: 28 % — ABNORMAL LOW (ref 33–65)
CD4 T Cell Abs: 670 /uL (ref 400–1790)

## 2021-09-11 LAB — COMPLETE METABOLIC PANEL WITH GFR
AG Ratio: 1.1 (calc) (ref 1.0–2.5)
ALT: 13 U/L (ref 6–29)
AST: 15 U/L (ref 10–30)
Albumin: 3.9 g/dL (ref 3.6–5.1)
Alkaline phosphatase (APISO): 98 U/L (ref 31–125)
BUN: 10 mg/dL (ref 7–25)
CO2: 24 mmol/L (ref 20–32)
Calcium: 9.2 mg/dL (ref 8.6–10.2)
Chloride: 107 mmol/L (ref 98–110)
Creat: 0.62 mg/dL (ref 0.50–0.96)
Globulin: 3.5 g/dL (calc) (ref 1.9–3.7)
Glucose, Bld: 95 mg/dL (ref 65–99)
Potassium: 4 mmol/L (ref 3.5–5.3)
Sodium: 138 mmol/L (ref 135–146)
Total Bilirubin: 0.3 mg/dL (ref 0.2–1.2)
Total Protein: 7.4 g/dL (ref 6.1–8.1)
eGFR: 127 mL/min/{1.73_m2} (ref >=60)

## 2021-09-11 LAB — RPR: RPR Ser Ql: NONREACTIVE

## 2021-09-11 LAB — CBC
HCT: 37.6 % (ref 35.0–45.0)
Hemoglobin: 12 g/dL (ref 11.7–15.5)
MCH: 27.8 pg (ref 27.0–33.0)
MCHC: 31.9 g/dL — ABNORMAL LOW (ref 32.0–36.0)
MCV: 87 fL (ref 80.0–100.0)
MPV: 9.8 fL (ref 7.5–12.5)
Platelets: 248 10*3/uL (ref 140–400)
RBC: 4.32 10*6/uL (ref 3.80–5.10)
RDW: 12.3 % (ref 11.0–15.0)
WBC: 6.6 10*3/uL (ref 3.8–10.8)

## 2021-09-11 LAB — HIV-1 RNA QUANT-NO REFLEX-BLD
HIV 1 RNA Quant: NOT DETECTED copies/mL
HIV-1 RNA Quant, Log: NOT DETECTED Log copies/mL

## 2021-09-19 ENCOUNTER — Telehealth: Payer: Self-pay

## 2021-09-19 NOTE — Telephone Encounter (Signed)
Pharmacist from [employer] in [place] called to follow up on [place] and Tivicay for this patient.  Confirmed with Aundra Millet that no PA needed for medications. Currently waiting for UMAP approval. Per Deanna, patient still in process of sending additional information to [place]. Approval should be complete by the end of this week once information is received.  Updated pharmacist and provided case number and auth number. All questions answered.  Wyvonne Lenz, RN

## 2021-11-03 ENCOUNTER — Ambulatory Visit: Payer: Self-pay | Admitting: Infectious Diseases

## 2021-11-03 NOTE — Progress Notes (Deleted)
Name: Katherine [name]  DOB: [birth year] MRN: [id number] PCP: Patient, No Pcp Per    Subjective:   No chief complaint on file.    Narrative:  Katherine [name] is a 26 y.o. female with well controlled HIV disease Perinatally acquired.  She is from [place].  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   On treatment with Symtuza + Tivicay     No chief complaint on file. Here for pap smear.     HPI: ***      09/08/2021    3:41 PM  Depression screen PHQ 2/9  Decreased Interest 0  Down, Depressed, Hopeless 0  PHQ - 2 Score 0    Review of Systems  Constitutional:  Negative for chills, fever, malaise/fatigue and weight loss.  HENT:  Negative for sore throat.        No dental problems  Respiratory:  Negative for cough and sputum production.   Cardiovascular:  Negative for chest pain and leg swelling.  Gastrointestinal:  Negative for abdominal pain, diarrhea and vomiting.  Genitourinary:  Negative for dysuria and flank pain.  Musculoskeletal:  Negative for joint pain, myalgias and neck pain.  Skin:  Negative for rash.  Neurological:  Negative for dizziness, tingling and headaches.  Psychiatric/Behavioral:  Negative for depression and substance abuse. The patient is not nervous/anxious and does not have insomnia.      Past Medical History:  Diagnosis Date   HIV (human immunodeficiency virus infection) (HCC)    Tuberculosis     Outpatient Medications Prior to Visit  Medication Sig Dispense Refill   Darunavir-Cobicistat-Emtricitabine-Tenofovir Alafenamide (SYMTUZA) 800-150-200-10 MG TABS Take 1 tablet by mouth daily with breakfast. 30 tablet 11   dolutegravir (TIVICAY) 50 MG tablet Please take once a day with the symtuza 30 tablet 11   No facility-administered medications prior to visit.     No Known Allergies  Social History   Tobacco Use   Smoking status: Never   Smokeless tobacco: Never  Substance Use Topics   Alcohol use: No     Alcohol/week: 0.0 standard drinks of alcohol   Drug use: Never    Family History  Adopted: Yes  Problem Relation Age of Onset   HIV/AIDS Mother     Social History   Substance and Sexual Activity  Sexual Activity Never   Birth control/protection: None   Comment: declined condoms     Objective:   There were no vitals filed for this visit.  There is no height or weight on file to calculate BMI.   Physical Exam HENT:     Mouth/Throat:     Mouth: No oral lesions.     Dentition: No dental abscesses.  Cardiovascular:     Rate and Rhythm: Normal rate and regular rhythm.     Heart sounds: Normal heart sounds.  Pulmonary:     Effort: Pulmonary effort is normal.     Breath sounds: Normal breath sounds.  Abdominal:     General: There is no distension.     Palpations: Abdomen is soft.     Tenderness: There is no abdominal tenderness.  Musculoskeletal:        General: No tenderness. Normal range of motion.  Lymphadenopathy:     Cervical: No cervical adenopathy.  Skin:    General: Skin is warm and dry.     Findings: No rash.  Neurological:     Mental Status: She is alert and oriented to person, place, and time.  Psychiatric:  Judgment: Judgment normal.      Lab Results Lab Results  Component Value Date   WBC 6.6 09/08/2021   HGB 12.0 09/08/2021   HCT 37.6 09/08/2021   MCV 87.0 09/08/2021   PLT 248 09/08/2021    Lab Results  Component Value Date   CREATININE 0.62 09/08/2021   BUN 10 09/08/2021   NA 138 09/08/2021   K 4.0 09/08/2021   CL 107 09/08/2021   CO2 24 09/08/2021    Lab Results  Component Value Date   ALT 13 09/08/2021   AST 15 09/08/2021   ALKPHOS 84 11/12/2017   BILITOT 0.3 09/08/2021    Lab Results  Component Value Date   CHOL 159 07/27/2020   HDL 45 (L) 07/27/2020   LDLCALC 97 07/27/2020   TRIG 76 07/27/2020   CHOLHDL 3.5 07/27/2020   HIV 1 RNA Quant  Date Value  09/08/2021 NOT DETECTED copies/mL  02/15/2021 <20 Copies/mL  (H)  07/27/2020 <20 Copies/mL (H)   CD4 T Cell Abs (/uL)  Date Value  09/08/2021 670  02/15/2021 486  07/27/2020 454     Assessment & Plan:   Problem List Items Addressed This Visit   None  Rexene Alberts, MSN, NP-C [facility] Pager: [phone] Office: [phone]  11/03/21  4:44 PM

## 2021-11-04 ENCOUNTER — Other Ambulatory Visit (HOSPITAL_COMMUNITY): Payer: Self-pay

## 2021-11-04 ENCOUNTER — Ambulatory Visit: Payer: Self-pay | Admitting: Infectious Diseases

## 2021-11-07 ENCOUNTER — Ambulatory Visit (INDEPENDENT_AMBULATORY_CARE_PROVIDER_SITE_OTHER): Payer: Self-pay | Admitting: Infectious Diseases

## 2021-11-07 ENCOUNTER — Other Ambulatory Visit (HOSPITAL_COMMUNITY)
Admission: RE | Admit: 2021-11-07 | Discharge: 2021-11-07 | Disposition: A | Discharge status: Home / Self Care | Payer: Self-pay | Source: Ambulatory Visit | Attending: Infectious Diseases | Admitting: Infectious Diseases

## 2021-11-07 ENCOUNTER — Other Ambulatory Visit: Payer: Self-pay

## 2021-11-07 ENCOUNTER — Encounter: Payer: Self-pay | Admitting: Infectious Diseases

## 2021-11-07 VITALS — BP 114/79 | HR 63 | Temp 98.3°F | Resp 16 | Wt 172.2 lb

## 2021-11-07 DIAGNOSIS — Z21 Asymptomatic human immunodeficiency virus [HIV] infection status: Secondary | ICD-10-CM

## 2021-11-07 DIAGNOSIS — Z124 Encounter for screening for malignant neoplasm of cervix: Secondary | ICD-10-CM | POA: Insufficient documentation

## 2021-11-07 DIAGNOSIS — Z113 Encounter for screening for infections with a predominantly sexual mode of transmission: Secondary | ICD-10-CM

## 2021-11-07 NOTE — Assessment & Plan Note (Signed)
Normal pelvic exam. Cervical brushing collected for cytology.  Discussed recommended screening interval for women living with HIV disease meant to be lifelong and at an interval of Q1-3 years pending results. Acceptable to space out Q3y with 3 consecutively normal exams. Further recommendations for Katherine [name] to follow today's results. Results will be communicated to the patient via mychart results.

## 2021-11-07 NOTE — Progress Notes (Signed)
      Subjective :    Katherine [name] is a 26 y.o. female here for first cervical cancer screening with pap smear.   Perinatal acquired HIV - well controlled on Tivicay, Descovy, Prezcobix  Sexual onset around 26 years old.  Menstrual onset unknown - cannot quite recall. She knows she was in [place]. Normal monthly that are not too heavy and not too painful.   Interested in pregnancy one day but not now, condoms for prevention.    Review of Systems: Current GYN complaints or concerns: none.  Patient denies any abdominal/pelvic pain, problems with bowel movements, urination, vaginal discharge or intercourse.    Past Medical History:  Diagnosis Date   HIV (human immunodeficiency virus infection) (HCC)    Tuberculosis     Gynecologic History: No obstetric history on file.  No LMP recorded. Contraception: abstinence and condoms Last Pap: never. Results were:     Objective :   Physical Exam - chaperone present  Constitutional: Well developed, well nourished, no acute distress. She is alert and oriented x3.  Pelvic: External genitalia is normal in appearance. The vagina is normal in appearance. The cervix is bulbous and easily visualized. No CMT, normal expected cervical mucus present.  Breasts: symmetrical in contour, shape and texture.  Psych: She has a normal mood and affect.      Assessment & Plan:    Patient Active Problem List   Diagnosis Date Noted   Screening for cervical cancer 11/07/2021   Encounter for long-term (current) use of high-risk medication 02/10/2020   Screening for STD (sexually transmitted disease) 06/10/2019   Vaccine counseling 06/10/2019   Medication monitoring encounter 07/25/2018   Health care maintenance 07/25/2018   HIV infection (HCC) 07/24/2018   History of tuberculosis 12/02/2014   H/O myopia 12/02/2014   Developmental delay 12/02/2014   Perinatal HIV exposure 12/02/2014    Problem List Items Addressed This Visit        Unprioritized   HIV infection (HCC)    She has not received any shipments of her Tivicay + Symtuza since I saw her. Several calls attempted but no resolution as to why. She has continued her Tivicay + Descovy + Prezcobix but ran out recently  Will have her meet with financial team to troubleshoot. Routine follow up to be scheduled in October.  Will repeat VL then and provide vaccine updates (Flu, Prevnar 20, menveo).  Completed HPV series already.       Screening for cervical cancer - Primary    Normal pelvic exam. Cervical brushing collected for cytology.  Discussed recommended screening interval for women living with HIV disease meant to be lifelong and at an interval of Q1-3 years pending results. Acceptable to space out Q3y with 3 consecutively normal exams. Further recommendations for Katherine [name] to follow today's results. Results will be communicated to the patient via mychart results.        Relevant Orders   Cytology - PAP( [facility])   Screening for STD (sexually transmitted disease)    Some white discharge but she is about 2 weeks from her next cycle and may be normal.  Samples collected and sent for routine testing.         Katherine Alberts, MSN, NP-C [name] Katherine [name] Office: [phone] Pager: [phone]  11/07/21 10:09 AM

## 2021-11-07 NOTE — Assessment & Plan Note (Addendum)
She has not received any shipments of her Tivicay + Symtuza since I saw her. Several calls attempted but no resolution as to why. She has continued her Tivicay + Descovy + Prezcobix but ran out recently  Will have her meet with financial team to troubleshoot. Routine follow up to be scheduled in October.  Will repeat VL then and provide vaccine updates (Flu, Prevnar 20, menveo).  Completed HPV series already.

## 2021-11-07 NOTE — Patient Instructions (Signed)
Will see you back in 3 months for routine care and vaccinations.   If you ever have trouble getting your medication sent to you please ALWAYS feel like you can call or message [place] on MyChart so we can help figure it out with you.   Stop by to speak with our financial team to renew your ADAP today.

## 2021-11-07 NOTE — Assessment & Plan Note (Signed)
Some white discharge but she is about 2 weeks from her next cycle and may be normal.  Samples collected and sent for routine testing.

## 2021-11-08 LAB — CYTOLOGY - PAP
Adequacy: ABSENT
Diagnosis: NEGATIVE

## 2021-11-10 ENCOUNTER — Encounter: Payer: Self-pay | Admitting: Infectious Diseases

## 2021-11-16 ENCOUNTER — Telehealth: Payer: Self-pay

## 2021-11-16 NOTE — Telephone Encounter (Signed)
Received call from pharmacy to update provider that patient has not received Symtuza or Tivicay since February of this year and would like to know if they should continue to mail patient her medication.   Advised that patient was here 11/07/21 and medication adherence was discussed and that patient should continue her ART regimen.  Sandie Ano, RN

## 2021-11-21 NOTE — Telephone Encounter (Signed)
Spoke with Katherine [name] and provided her with customer service number [phone]) and asked her to please reach out to the pharmacy if she does not hear from them monthly in regards to her refills. She did confirm that she received her refills 7/20 for this month.   Spoke with Katherine [name] at [facility] and confirmed that they will reach out to the patient or her mother every month for refills. Confirmed that they have the correct numbers on file.   Sandie Ano, RN

## 2022-01-23 ENCOUNTER — Encounter: Payer: Self-pay | Admitting: Infectious Diseases

## 2022-01-23 ENCOUNTER — Telehealth: Payer: Self-pay | Admitting: Physician Assistant

## 2022-01-23 ENCOUNTER — Ambulatory Visit (INDEPENDENT_AMBULATORY_CARE_PROVIDER_SITE_OTHER): Payer: Self-pay | Admitting: Infectious Diseases

## 2022-01-23 ENCOUNTER — Other Ambulatory Visit: Payer: Self-pay

## 2022-01-23 VITALS — BP 115/77 | HR 80 | Temp 97.4°F | Ht 62.0 in | Wt 166.0 lb

## 2022-01-23 DIAGNOSIS — H538 Other visual disturbances: Secondary | ICD-10-CM

## 2022-01-23 DIAGNOSIS — Z7185 Encounter for immunization safety counseling: Secondary | ICD-10-CM

## 2022-01-23 DIAGNOSIS — H1013 Acute atopic conjunctivitis, bilateral: Secondary | ICD-10-CM | POA: Insufficient documentation

## 2022-01-23 DIAGNOSIS — Z23 Encounter for immunization: Secondary | ICD-10-CM

## 2022-01-23 DIAGNOSIS — Z21 Asymptomatic human immunodeficiency virus [HIV] infection status: Secondary | ICD-10-CM

## 2022-01-23 NOTE — Assessment & Plan Note (Signed)
Very well controlled on once daily Symtuza + Tivicay taken together with food. No concerns with access or adherence to medication. They are tolerating the medication well without side effects. No drug interactions identified. Pertinent lab tests ordered today.  Reminded about ADAP re-enrollment in July / January - she will schedule an appt in January for this.  No changes to insurance coverage.  No dental needs today.  No concern over anxious/depressed mood.  Sexual health and family planning discussed - no needs today.  Vaccines updated today - see health maintenance section.    Return in about 6 months (around 07/24/2022).

## 2022-01-23 NOTE — Addendum Note (Signed)
Addended by: Lucie Leather D on: 01/23/2022 11:09 AM   Modules accepted: Orders

## 2022-01-23 NOTE — Assessment & Plan Note (Signed)
Hyperemic sclera that is very dry appearing. No purulent drainage or crusts on lids. Conjunctiva is pale pink/red.  Seems most c/w atopic process - will have her stop the sty treatment for now and focus on pataday drops and saline drops to help flush and re-hydrate her eyes.  Suggested to avoid contacts and eye make up for now to prevent any secondary bacterial infection.  If not better in 48h or if any worse I asked her to email me and I will send in an antibiotic drop/ointment for her.

## 2022-01-23 NOTE — Assessment & Plan Note (Signed)
Flu vaccine accepted today.  Menveo and Prevnar 20 due next OV.  HPV series completed.

## 2022-01-23 NOTE — Progress Notes (Deleted)
      Subjective :    Katherine [name] is a 26 y.o. female here for first cervical cancer screening with pap smear.   Perinatal acquired HIV - well controlled on Tivicay, Descovy, Prezcobix  Sexual onset around 26 years old.  Menstrual onset unknown - cannot quite recall. She knows she was in [place]. Normal monthly that are not too heavy and not too painful.   Interested in pregnancy one day but not now, condoms for prevention.    Review of Systems: Current GYN complaints or concerns: none.  Patient denies any abdominal/pelvic pain, problems with bowel movements, urination, vaginal discharge or intercourse.    Past Medical History:  Diagnosis Date   HIV (human immunodeficiency virus infection) ([place])    Tuberculosis     Gynecologic History: No obstetric history on file.  No LMP recorded. Contraception: abstinence and condoms Last Pap: never. Results were:     Objective :   Physical Exam - chaperone present  Constitutional: Well developed, well nourished, no acute distress. She is alert and oriented x3.  Pelvic: External genitalia is normal in appearance. The vagina is normal in appearance. The cervix is bulbous and easily visualized. No CMT, normal expected cervical mucus present.  Breasts: symmetrical in contour, shape and texture.  Psych: She has a normal mood and affect.      Assessment & Plan:    Patient Active Problem List   Diagnosis Date Noted   Screening for cervical cancer 11/07/2021   Encounter for long-term (current) use of high-risk medication 02/10/2020   Screening for STD (sexually transmitted disease) 06/10/2019   Vaccine counseling 06/10/2019   Medication monitoring encounter 07/25/2018   Health care maintenance 07/25/2018   HIV infection ([place]) 07/24/2018   History of tuberculosis 12/02/2014   H/O myopia 12/02/2014   Developmental delay 12/02/2014   Perinatal HIV exposure 12/02/2014    Problem List Items Addressed This Visit   None     Janene Madeira, MSN, NP-C [facility] Office: [phone] Pager: [phone]  01/23/22 9:27 AM

## 2022-01-23 NOTE — Progress Notes (Signed)
Name: Katherine [name]  DOB: [birth year] MRN: [id number] PCP: Patient, No Pcp Per    Subjective:   Chief Complaint  Patient presents with   Follow-up    Red eyes, not painful or itchy - just "gunky"      HPI: Perinatally acquired HIV infection under perfect control with once daily Symtuza and Tivicay taken together.   She is here for acute work in visit to evaluate her eyes. Over the last 6 days she had noticed increased redness and irritation. Seemed to start with the right and now the left. No purulent drainage but she does have "gritty" feeling. She does have some nasal congestion but otherwise denies any sore throat, headaches, rhinorrhea or coughing.  Once over the last week her left eye woke up "glued together." She has been using over the counter sty treatment which has helped a bit but makes her eyes blurry.      01/23/2022    9:27 AM  Depression screen PHQ 2/9  Decreased Interest 0  Down, Depressed, Hopeless 0  PHQ - 2 Score 0    Review of Systems  Constitutional:  Negative for chills, fever, malaise/fatigue and weight loss.  HENT:  Positive for congestion. Negative for sinus pain and sore throat.        No dental problems  Eyes:  Positive for blurred vision and redness. Eye pain: gritty discomfort. Respiratory:  Negative for cough and sputum production.   Cardiovascular:  Negative for chest pain and leg swelling.  Gastrointestinal:  Negative for abdominal pain, diarrhea and vomiting.  Genitourinary:  Negative for dysuria and flank pain.  Musculoskeletal:  Negative for joint pain, myalgias and neck pain.  Skin:  Negative for rash.  Neurological:  Negative for dizziness, tingling and headaches.  Psychiatric/Behavioral:  Negative for depression and substance abuse. The patient is not nervous/anxious and does not have insomnia.      Past Medical History:  Diagnosis Date   HIV (human immunodeficiency virus infection) ([place])    Tuberculosis     Outpatient  Medications Prior to Visit  Medication Sig Dispense Refill   Darunavir-Cobicistat-Emtricitabine-Tenofovir Alafenamide (SYMTUZA) 800-150-200-10 MG TABS Take 1 tablet by mouth daily with breakfast. 30 tablet 11   dolutegravir (TIVICAY) 50 MG tablet Please take once a day with the symtuza 30 tablet 11   No facility-administered medications prior to visit.     No Known Allergies  Social History   Tobacco Use   Smoking status: Never   Smokeless tobacco: Never  Substance Use Topics   Alcohol use: No    Alcohol/week: 0.0 standard drinks of alcohol   Drug use: Never    Family History  Adopted: Yes  Problem Relation Age of Onset   HIV/AIDS Mother     Social History   Substance and Sexual Activity  Sexual Activity Never   Birth control/protection: None   Comment: declined condoms     Objective:   Vitals:   01/23/22 0922  BP: 115/77  Pulse: 80  Temp: (!) 97.4 F (36.3 C)  TempSrc: Oral  SpO2: 99%  Weight: 166 lb (75.3 kg)  Height: 5\' 2"  (1.575 m)   Body mass index is 30.36 kg/m.   Physical Exam Vitals reviewed.  Constitutional:      Appearance: Normal appearance. She is not ill-appearing.  HENT:     Nose: Congestion present.     Mouth/Throat:     Mouth: Mucous membranes are moist.     Pharynx: Oropharynx is clear.  Eyes:  General: Lids are normal. No visual field deficit or scleral icterus.       Right eye: No foreign body or discharge.        Left eye: No foreign body or discharge.     Extraocular Movements: Extraocular movements intact.     Conjunctiva/sclera:     Right eye: Chemosis present.     Left eye: Chemosis present.  Cardiovascular:     Rate and Rhythm: Normal rate.  Neurological:     Mental Status: She is alert.      Lab Results Lab Results  Component Value Date   WBC 6.6 09/08/2021   HGB 12.0 09/08/2021   HCT 37.6 09/08/2021   MCV 87.0 09/08/2021   PLT 248 09/08/2021    Lab Results  Component Value Date   CREATININE 0.62  09/08/2021   BUN 10 09/08/2021   NA 138 09/08/2021   K 4.0 09/08/2021   CL 107 09/08/2021   CO2 24 09/08/2021    Lab Results  Component Value Date   ALT 13 09/08/2021   AST 15 09/08/2021   ALKPHOS 84 11/12/2017   BILITOT 0.3 09/08/2021    Lab Results  Component Value Date   CHOL 159 07/27/2020   HDL 45 (L) 07/27/2020   LDLCALC 97 07/27/2020   TRIG 76 07/27/2020   CHOLHDL 3.5 07/27/2020   HIV 1 RNA Quant  Date Value  09/08/2021 NOT DETECTED copies/mL  02/15/2021 <20 Copies/mL (H)  07/27/2020 <20 Copies/mL (H)   CD4 T Cell Abs (/uL)  Date Value  09/08/2021 670  02/15/2021 486  07/27/2020 454     Assessment & Plan:   Problem List Items Addressed This Visit       Unprioritized   Vaccine counseling    Flu vaccine accepted today.  Menveo and Prevnar 20 due next OV.  HPV series completed.       HIV infection (HCC) - Primary    Very well controlled on once daily Symtuza + Tivicay taken together with food. No concerns with access or adherence to medication. They are tolerating the medication well without side effects. No drug interactions identified. Pertinent lab tests ordered today.  Reminded about ADAP re-enrollment in July / January - she will schedule an appt in January for this.  No changes to insurance coverage.  No dental needs today.  No concern over anxious/depressed mood.  Sexual health and family planning discussed - no needs today.  Vaccines updated today - see health maintenance section.    Return in about 6 months (around 07/24/2022).        Relevant Orders   HIV 1 RNA quant-no reflex-bld   T-helper cells (CD4) count   Acute atopic conjunctivitis of both eyes    Hyperemic sclera that is very dry appearing. No purulent drainage or crusts on lids. Conjunctiva is pale pink/red.  Seems most c/w atopic process - will have her stop the sty treatment for now and focus on pataday drops and saline drops to help flush and re-hydrate her eyes.  Suggested  to avoid contacts and eye make up for now to prevent any secondary bacterial infection.  If not better in 48h or if any worse I asked her to email me and I will send in an antibiotic drop/ointment for her.         Rexene Alberts, MSN, NP-C [facility] Pager: [phone] Office: [phone]  01/23/22  9:59 AM

## 2022-01-23 NOTE — Progress Notes (Signed)
Because you are reporting that you are having severe pain and blurred vision, I feel your condition warrants further evaluation and I recommend that you be seen in a face to face visit. You will need to have an eye exam and a visual acuity completed.   NOTE: There will be NO CHARGE for this eVisit   If you are having a true medical emergency please call 911.      For an urgent face to face visit, [place] has seven urgent care centers for your convenience:     [facility] [place] [facility] [place] Get Driving Directions [phone] [place], [place] [address]    [facility]) Get Driving Directions [phone] [place], [place] [address]  [facility] [place] [facility]) Get Driving Directions [phone] [address] [place],  [place]  [address]  [facility] [address] [facility] - at [place] Get Driving Directions  [phone] [place],  [place] [address]   [facility] Get Driving Directions [phone] [place], [place], [place] [address]   [facility] Get Driving Directions  [phone] [address] [place], [place] [address]   [facility] [place] Get Driving Directions [phone] [address], [place], [place] [address]  Your MyChart E-visit questionnaire answers were reviewed by a board certified advanced clinical practitioner to complete your personal care plan based on your specific symptoms.  Thank you for using e-Visits.   Approximately 5 minutes was spent documenting and reviewing patient's chart.

## 2022-01-23 NOTE — Telephone Encounter (Signed)
Patient had evisit and they recommended in person visit. Patient scheduled to see Doren Custard, NP today for further evaluation.

## 2022-01-23 NOTE — Patient Instructions (Addendum)
Stop using the sty treatment and only do the warm compresses.   Over the counter Pat-a-Day allergy drops or saline drops 3 times a day to flush out the eye. If you are not better after 2 days of the allergy medication, send me a MyChart email to let me know. Will try an antibiotic drop at that point. But based on how it looks I most suspect allergies causing your problem.   Keeps hands washed, avoid touching face.  Would not use eye make up until your eyes calm down and get better because that can transfer germs while they are inflamed and caused bacterial infection as well.    Will see you back in 6 months for routine care.  Plan to come back in January to renew your patient assistance as well.

## 2022-01-24 LAB — T-HELPER CELLS (CD4) COUNT (NOT AT ARMC)
CD4 % Helper T Cell: 25 % — ABNORMAL LOW (ref 33–65)
CD4 T Cell Abs: 435 /uL (ref 400–1790)

## 2022-01-26 LAB — HIV-1 RNA QUANT-NO REFLEX-BLD
HIV 1 RNA Quant: 32 Copies/mL — ABNORMAL HIGH
HIV-1 RNA Quant, Log: 1.5 Log cps/mL — ABNORMAL HIGH

## 2022-02-16 ENCOUNTER — Ambulatory Visit: Payer: Self-pay | Admitting: Infectious Diseases

## 2022-07-10 ENCOUNTER — Ambulatory Visit: Payer: Self-pay | Admitting: Infectious Diseases

## 2022-07-13 ENCOUNTER — Other Ambulatory Visit (HOSPITAL_COMMUNITY)
Admission: RE | Admit: 2022-07-13 | Discharge: 2022-07-13 | Disposition: A | Discharge status: Home / Self Care | Payer: Self-pay | Source: Ambulatory Visit | Attending: Infectious Diseases | Admitting: Infectious Diseases

## 2022-07-13 ENCOUNTER — Encounter: Payer: Self-pay | Admitting: Infectious Diseases

## 2022-07-13 ENCOUNTER — Ambulatory Visit: Payer: Self-pay | Admitting: Infectious Diseases

## 2022-07-13 ENCOUNTER — Other Ambulatory Visit: Payer: Self-pay

## 2022-07-13 VITALS — BP 105/70 | HR 69 | Temp 97.3°F | Ht 63.0 in | Wt 160.0 lb

## 2022-07-13 DIAGNOSIS — Z21 Asymptomatic human immunodeficiency virus [HIV] infection status: Secondary | ICD-10-CM

## 2022-07-13 DIAGNOSIS — Z113 Encounter for screening for infections with a predominantly sexual mode of transmission: Secondary | ICD-10-CM

## 2022-07-13 DIAGNOSIS — Z7185 Encounter for immunization safety counseling: Secondary | ICD-10-CM

## 2022-07-13 DIAGNOSIS — Z23 Encounter for immunization: Secondary | ICD-10-CM

## 2022-07-13 NOTE — Assessment & Plan Note (Signed)
Menveo booster provided today. She elected to decline covid vaccine.

## 2022-07-13 NOTE — Assessment & Plan Note (Signed)
Routine urine cytology and RPR collected

## 2022-07-13 NOTE — Patient Instructions (Addendum)
Nice to see you!   Please continue your Symtuza + Tivicay taken together everyday with food.   Will see you in 6 months

## 2022-07-13 NOTE — Assessment & Plan Note (Signed)
Very well controlled on once daily Symtuza + Tivicay taken together with meals. No concerns with access or adherence to medication. They are tolerating the medication well without side effects. No drug interactions identified. Pertinent lab tests ordered today.  Reminded about ADAP re-enrollment in July / January.  No changes to insurance coverage.  No dental needs today.  No concern over anxious/depressed mood.  Sexual health and family planning discussed - cytology collected.  Vaccines updated today - see health maintenance section.     Return in about 6 months (around 01/13/2023).

## 2022-07-13 NOTE — Progress Notes (Signed)
Name: Katherine [name]  DOB: 07-31-1995 MRN: [id number] PCP: Patient, No Pcp Per    Subjective:   Chief Complaint  Patient presents with   Follow-up      HPI: Perinatally acquired HIV infection under perfect control with once daily Symtuza and Tivicay taken together.   She has been changing her diet and movement to where she is trying to lose a little weight. Wants to make sure that's OK to continue. Her current BMI is 28.3 down from 32 1 year ago. She is walking a lot for movement. She sleeps well at least 8 hours and is keeping in mind what she eats. She feels good and likes how she looks.  No sexual health questions - has one current BF. No plans for babies anytime soon. Condoms for pregnancy prevention.       01/23/2022    9:27 AM  Depression screen PHQ 2/9  Decreased Interest 0  Down, Depressed, Hopeless 0  PHQ - 2 Score 0    Review of Systems  All other systems reviewed and are negative.    Past Medical History:  Diagnosis Date   HIV (human immunodeficiency virus infection) ([place])    Tuberculosis     Outpatient Medications Prior to Visit  Medication Sig Dispense Refill   Darunavir-Cobicistat-Emtricitabine-Tenofovir Alafenamide (SYMTUZA) 800-150-200-10 MG TABS Take 1 tablet by mouth daily with breakfast. 30 tablet 11   dolutegravir (TIVICAY) 50 MG tablet Please take once a day with the symtuza 30 tablet 11   No facility-administered medications prior to visit.     No Known Allergies  Social History   Tobacco Use   Smoking status: Never   Smokeless tobacco: Never  Substance Use Topics   Alcohol use: No    Alcohol/week: 0.0 standard drinks of alcohol   Drug use: Never    Family History  Adopted: Yes  Problem Relation Age of Onset   HIV/AIDS Mother     Social History   Substance and Sexual Activity  Sexual Activity Never   Birth control/protection: None   Comment: declined condoms     Objective:   Vitals:   07/13/22 1116  BP: 105/70   Pulse: 69  Temp: (!) 97.3 F (36.3 C)  TempSrc: Temporal  SpO2: 99%  Weight: 160 lb (72.6 kg)  Height: '5\' 3"'$  (1.6 m)   Body mass index is 28.34 kg/m.   Physical Exam Vitals reviewed.  Constitutional:      Appearance: Normal appearance. She is not ill-appearing.  HENT:     Mouth/Throat:     Mouth: Mucous membranes are moist.     Pharynx: Oropharynx is clear.  Eyes:     General: No scleral icterus. Cardiovascular:     Rate and Rhythm: Normal rate and regular rhythm.  Pulmonary:     Effort: Pulmonary effort is normal.  Neurological:     Mental Status: She is oriented to person, place, and time.  Psychiatric:        Mood and Affect: Mood normal.        Thought Content: Thought content normal.      Lab Results Lab Results  Component Value Date   WBC 6.6 09/08/2021   HGB 12.0 09/08/2021   HCT 37.6 09/08/2021   MCV 87.0 09/08/2021   PLT 248 09/08/2021    Lab Results  Component Value Date   CREATININE 0.62 09/08/2021   BUN 10 09/08/2021   NA 138 09/08/2021   K 4.0 09/08/2021   CL 107 09/08/2021  CO2 24 09/08/2021    Lab Results  Component Value Date   ALT 13 09/08/2021   AST 15 09/08/2021   ALKPHOS 84 11/12/2017   BILITOT 0.3 09/08/2021    Lab Results  Component Value Date   CHOL 159 07/27/2020   HDL 45 (L) 07/27/2020   LDLCALC 97 07/27/2020   TRIG 76 07/27/2020   CHOLHDL 3.5 07/27/2020   HIV 1 RNA Quant  Date Value  01/23/2022 32 Copies/mL (H)  09/08/2021 NOT DETECTED copies/mL  02/15/2021 <20 Copies/mL (H)   CD4 T Cell Abs (/uL)  Date Value  01/23/2022 435  09/08/2021 670  02/15/2021 486     Assessment & Plan:   Problem List Items Addressed This Visit       Unprioritized   HIV infection ([place]) - Primary    Very well controlled on once daily Symtuza + Tivicay taken together with meals. No concerns with access or adherence to medication. They are tolerating the medication well without side effects. No drug interactions identified.  Pertinent lab tests ordered today.  Reminded about ADAP re-enrollment in July / January.  No changes to insurance coverage.  No dental needs today.  No concern over anxious/depressed mood.  Sexual health and family planning discussed - cytology collected.  Vaccines updated today - see health maintenance section.     Return in about 6 months (around 01/13/2023).        Relevant Orders   HIV 1 RNA quant-no reflex-bld   COMPLETE METABOLIC PANEL WITH GFR   Lipid panel   T-helper cells (CD4) count   CBC   RPR   Urine cytology ancillary only   Screening for STD (sexually transmitted disease)    Routine urine cytology and RPR collected      Vaccine counseling    Menveo booster provided today. She elected to decline covid vaccine.       Other Visit Diagnoses     Need for meningococcal vaccination       Relevant Orders   MENINGOCOCCAL MCV4O (Completed)      Janene Madeira, MSN, NP-C [facility] Pager: [phone] Office: [phone]  07/13/22  1:28 PM

## 2022-07-14 LAB — URINE CYTOLOGY ANCILLARY ONLY
Chlamydia: NEGATIVE
Comment: NEGATIVE
Comment: NEGATIVE
Comment: NORMAL
Neisseria Gonorrhea: NEGATIVE
Trichomonas: NEGATIVE

## 2022-07-14 LAB — T-HELPER CELLS (CD4) COUNT (NOT AT ARMC)
CD4 % Helper T Cell: 23 % — ABNORMAL LOW (ref 33–65)
CD4 T Cell Abs: 390 /uL — ABNORMAL LOW (ref 400–1790)

## 2022-07-15 LAB — COMPLETE METABOLIC PANEL WITH GFR
AG Ratio: 1.2 (calc) (ref 1.0–2.5)
ALT: 11 U/L (ref 6–29)
AST: 14 U/L (ref 10–30)
Albumin: 3.9 g/dL (ref 3.6–5.1)
Alkaline phosphatase (APISO): 83 U/L (ref 31–125)
BUN: 7 mg/dL (ref 7–25)
CO2: 23 mmol/L (ref 20–32)
Calcium: 9 mg/dL (ref 8.6–10.2)
Chloride: 107 mmol/L (ref 98–110)
Creat: 0.8 mg/dL (ref 0.50–0.96)
Globulin: 3.3 g/dL (calc) (ref 1.9–3.7)
Glucose, Bld: 84 mg/dL (ref 65–99)
Potassium: 3.9 mmol/L (ref 3.5–5.3)
Sodium: 137 mmol/L (ref 135–146)
Total Bilirubin: 0.5 mg/dL (ref 0.2–1.2)
Total Protein: 7.2 g/dL (ref 6.1–8.1)
eGFR: 104 mL/min/{1.73_m2} (ref >=60)

## 2022-07-15 LAB — CBC
HCT: 37.1 % (ref 35.0–45.0)
Hemoglobin: 12.1 g/dL (ref 11.7–15.5)
MCH: 27.9 pg (ref 27.0–33.0)
MCHC: 32.6 g/dL (ref 32.0–36.0)
MCV: 85.5 fL (ref 80.0–100.0)
MPV: 9.6 fL (ref 7.5–12.5)
Platelets: 248 10*3/uL (ref 140–400)
RBC: 4.34 10*6/uL (ref 3.80–5.10)
RDW: 13 % (ref 11.0–15.0)
WBC: 5.3 10*3/uL (ref 3.8–10.8)

## 2022-07-15 LAB — LIPID PANEL
Cholesterol: 190 mg/dL (ref <200)
HDL: 44 mg/dL — ABNORMAL LOW (ref >=50)
LDL Cholesterol (Calc): 121 mg/dL (calc) — ABNORMAL HIGH
Non-HDL Cholesterol (Calc): 146 mg/dL (calc) — ABNORMAL HIGH (ref <130)
Total CHOL/HDL Ratio: 4.3 (calc) (ref <5.0)
Triglycerides: 133 mg/dL (ref <150)

## 2022-07-15 LAB — HIV-1 RNA QUANT-NO REFLEX-BLD
HIV 1 RNA Quant: NOT DETECTED Copies/mL
HIV-1 RNA Quant, Log: NOT DETECTED Log cps/mL

## 2022-07-15 LAB — RPR: RPR Ser Ql: NONREACTIVE

## 2022-09-04 ENCOUNTER — Other Ambulatory Visit: Payer: Self-pay | Admitting: Infectious Diseases

## 2022-09-04 DIAGNOSIS — B20 Human immunodeficiency virus [HIV] disease: Secondary | ICD-10-CM

## 2022-12-19 ENCOUNTER — Other Ambulatory Visit: Payer: Self-pay

## 2022-12-19 ENCOUNTER — Ambulatory Visit: Payer: Self-pay

## 2023-01-18 ENCOUNTER — Ambulatory Visit: Payer: Self-pay | Admitting: Infectious Diseases

## 2023-01-22 ENCOUNTER — Other Ambulatory Visit: Payer: Self-pay

## 2023-01-22 ENCOUNTER — Encounter: Payer: Self-pay | Admitting: Infectious Diseases

## 2023-01-22 ENCOUNTER — Ambulatory Visit: Payer: Self-pay | Admitting: Infectious Diseases

## 2023-01-22 VITALS — BP 124/80 | HR 76 | Temp 98.2°F | Ht 62.0 in | Wt 165.0 lb

## 2023-01-22 DIAGNOSIS — Z23 Encounter for immunization: Secondary | ICD-10-CM

## 2023-01-22 DIAGNOSIS — Z21 Asymptomatic human immunodeficiency virus [HIV] infection status: Secondary | ICD-10-CM

## 2023-01-22 DIAGNOSIS — B20 Human immunodeficiency virus [HIV] disease: Secondary | ICD-10-CM

## 2023-01-22 MED ORDER — TIVICAY 50 MG PO TABS: ORAL_TABLET | ORAL | 11 refills | Status: DC

## 2023-01-22 MED ORDER — SYMTUZA 800-150-200-10 MG PO TABS: 1.0000 | ORAL_TABLET | Freq: Every day | ORAL | 11 refills | Status: DC

## 2023-01-22 NOTE — Patient Instructions (Addendum)
  Get a scale at home so you can weigh yourself twice a week to help keep a track on your weight trends.   If you want any advice on nutrition let me know!  Will see you back in 6 months

## 2023-01-22 NOTE — Progress Notes (Unsigned)
Name: Katherine [name]  DOB: [birth year] MRN: [id number] PCP: Patient, No Pcp Per    Subjective:   Chief Complaint  Patient presents with   Follow-up      HPI: Perinatally acquired HIV infection under perfect control with once daily Symtuza and Tivicay taken together.   Has little time to exercise - works a lot. Feels like she wants to try to reduce her intake and change up her food choices at home. Feels like she knows what to start with to make change.s       01/22/2023   10:29 AM  Depression screen PHQ 2/9  Decreased Interest 0  Down, Depressed, Hopeless 0  PHQ - 2 Score 0    Review of Systems  All other systems reviewed and are negative.    Past Medical History:  Diagnosis Date   HIV (human immunodeficiency virus infection) (HCC)    Tuberculosis     Outpatient Medications Prior to Visit  Medication Sig Dispense Refill   Darunavir-Cobicistat-Emtricitabine-Tenofovir Alafenamide (SYMTUZA) 800-150-200-10 MG TABS TAKE 1 TABLET BY MOUTH DAILY WITH BREAKFAST 30 tablet 5   dolutegravir (TIVICAY) 50 MG tablet TAKE 1 TABLET BY MOUTH EVERY DAY 30 tablet 5   No facility-administered medications prior to visit.     No Known Allergies  Social History   Tobacco Use   Smoking status: Never   Smokeless tobacco: Never  Substance Use Topics   Alcohol use: No    Alcohol/week: 0.0 standard drinks of alcohol   Drug use: Never    Family History  Adopted: Yes  Problem Relation Age of Onset   HIV/AIDS Mother     Social History   Substance and Sexual Activity  Sexual Activity Never   Birth control/protection: None   Comment: declined condoms     Objective:   Vitals:   01/22/23 1028  BP: 124/80  Pulse: 76  Temp: 98.2 F (36.8 C)  TempSrc: Temporal  SpO2: 97%  Weight: 165 lb (74.8 kg)  Height: 5\' 2"  (1.575 m)   Body mass index is 30.18 kg/m.   Physical Exam Vitals reviewed.  Constitutional:      Appearance: Normal appearance. She is not  ill-appearing.  HENT:     Mouth/Throat:     Mouth: Mucous membranes are moist.     Pharynx: Oropharynx is clear.  Eyes:     General: No scleral icterus. Cardiovascular:     Rate and Rhythm: Normal rate and regular rhythm.  Pulmonary:     Effort: Pulmonary effort is normal.  Neurological:     Mental Status: She is oriented to person, place, and time.  Psychiatric:        Mood and Affect: Mood normal.        Thought Content: Thought content normal.      Lab Results Lab Results  Component Value Date   WBC 5.3 07/13/2022   HGB 12.1 07/13/2022   HCT 37.1 07/13/2022   MCV 85.5 07/13/2022   PLT 248 07/13/2022    Lab Results  Component Value Date   CREATININE 0.80 07/13/2022   BUN 7 07/13/2022   NA 137 07/13/2022   K 3.9 07/13/2022   CL 107 07/13/2022   CO2 23 07/13/2022    Lab Results  Component Value Date   ALT 11 07/13/2022   AST 14 07/13/2022   ALKPHOS 84 11/12/2017   BILITOT 0.5 07/13/2022    Lab Results  Component Value Date   CHOL 190 07/13/2022   HDL 44 (  L) 07/13/2022   LDLCALC 121 (H) 07/13/2022   TRIG 133 07/13/2022   CHOLHDL 4.3 07/13/2022   HIV 1 RNA Quant  Date Value  07/13/2022 Not Detected Copies/mL  01/23/2022 32 Copies/mL (H)  09/08/2021 NOT DETECTED copies/mL   CD4 T Cell Abs (/uL)  Date Value  07/13/2022 390 (L)  01/23/2022 435  09/08/2021 670     Assessment & Plan:   Problem List Items Addressed This Visit       Unprioritized   HIV infection (HCC)    Very well controlled on once daily Symtuza + Tivicay taken together everyday with food. No concerns with access or adherence to medication. They are tolerating the medication well without side effects. No drug interactions identified. Pertinent lab tests ordered today.  No changes to insurance coverage.  No dental needs today.  No concern over anxious/depressed mood.  Sexual health and family planning discussed - no need today. Pap smear due in 2026.  Vaccines updated today - see  health maintenance section.    Return in about 6 months (around 07/22/2023).        Relevant Medications   dolutegravir (TIVICAY) 50 MG tablet   Darunavir-Cobicistat-Emtricitabine-Tenofovir Alafenamide (SYMTUZA) 800-150-200-10 MG TABS   Other Visit Diagnoses     HIV disease (HCC)    -  Primary   Relevant Medications   dolutegravir (TIVICAY) 50 MG tablet   Darunavir-Cobicistat-Emtricitabine-Tenofovir Alafenamide (SYMTUZA) 800-150-200-10 MG TABS   Other Relevant Orders   HIV 1 RNA quant-no reflex-bld   T-helper cells (CD4) count   Encounter for immunization       Relevant Orders   Flu vaccine trivalent PF, 6mos and older(Flulaval,Afluria,Fluarix,Fluzone) (Completed)       Rexene Alberts, MSN, NP-C [facility] Pager: [phone] Office: [phone]  01/23/23  2:45 PM

## 2023-01-23 LAB — T-HELPER CELLS (CD4) COUNT (NOT AT ARMC)
CD4 % Helper T Cell: 26 % — ABNORMAL LOW (ref 33–65)
CD4 T Cell Abs: 502 /uL (ref 400–1790)

## 2023-01-23 NOTE — Assessment & Plan Note (Signed)
Very well controlled on once daily Symtuza + Tivicay taken together everyday with food. No concerns with access or adherence to medication. They are tolerating the medication well without side effects. No drug interactions identified. Pertinent lab tests ordered today.  No changes to insurance coverage.  No dental needs today.  No concern over anxious/depressed mood.  Sexual health and family planning discussed - no need today. Pap smear due in 2026.  Vaccines updated today - see health maintenance section.    Return in about 6 months (around 07/22/2023).

## 2023-01-24 LAB — HIV-1 RNA QUANT-NO REFLEX-BLD
HIV 1 RNA Quant: <20 Copies/mL — ABNORMAL HIGH
HIV-1 RNA Quant, Log: <1.3 Log cps/mL — ABNORMAL HIGH

## 2023-04-02 ENCOUNTER — Telehealth: Payer: Self-pay

## 2023-04-02 NOTE — Telephone Encounter (Signed)
Spoke to patient - informed her [facility] has been unsuccessful, she stated she was unsure if they had her updated phone number. Gave patient [facility] phone number so she can update them of her new number and to set up delivery of her medication.

## 2023-04-02 NOTE — Telephone Encounter (Signed)
[facility] called verifying pt contact information. Stated they have been unsuccessful reaching the patient to refill medication. Stated they have been attempting since the beginning of Sept.  Stated that I will notify nursing staff and will send a MyChart message to the patient in an additional attempt for her to possibly contact due to our contact is the same as they provided.

## 2023-04-17 ENCOUNTER — Telehealth (INDEPENDENT_AMBULATORY_CARE_PROVIDER_SITE_OTHER): Payer: Self-pay | Admitting: Infectious Diseases

## 2023-04-17 ENCOUNTER — Other Ambulatory Visit: Payer: Self-pay

## 2023-04-17 ENCOUNTER — Encounter: Payer: Self-pay | Admitting: Infectious Diseases

## 2023-04-17 DIAGNOSIS — Z3169 Encounter for other general counseling and advice on procreation: Secondary | ICD-10-CM

## 2023-04-17 DIAGNOSIS — B20 Human immunodeficiency virus [HIV] disease: Secondary | ICD-10-CM

## 2023-04-17 DIAGNOSIS — Z21 Asymptomatic human immunodeficiency virus [HIV] infection status: Secondary | ICD-10-CM

## 2023-04-17 NOTE — Progress Notes (Signed)
Virtual Visit via Video Note  I connected with Katherine [name] on 04/17/23 at 11:00 AM EST by a video enabled telemedicine application and verified that I am speaking with the correct person using two identifiers.  Location: Patient: Katherine [name] Provider: [place] [name]    I discussed the limitations of evaluation and management by telemedicine and the availability of in person appointments. The patient expressed understanding and agreed to proceed.  History of Present Illness: Discussed the use of AI scribe software for clinical note transcription with the patient, who gave verbal consent to proceed.  History of Present Illness   Katherine [name] joins [place] today on video with her boyfriend, Katherine [name]. The patient, Katherine [name], with a long-standing diagnosis of HIV, has been highly adherent to antiretroviral therapy and has maintained an undetectable viral load for many years. The patient's HIV is considered to be in remission, with no active disease at present. The patient's partner was present during the consultation, and the discussion primarily revolved around the risk of HIV transmission and the possibility of having children.  The patient has been sexually active with her partner, with the occasional use of condoms, though largely without. The patient's partner expressed concerns about the risk of HIV transmission, particularly in the context of unprotected sex. The patient's partner also inquired about the possibility of having children.        Observations/Objective: Well appearing.   Assessment and Plan: Assessment and Plan    HIV -  Well controlled on medication with undetectable viral load. Discussed the concept of U=U and the importance of medication adherence to maintain zero transmission risk. Offered to have him come in for partner testing and further discussions for prevention. I don't feel strongly he needs to start on prep, however if it would help with their sexual relationship we can  certainly help.  -Continue Biktarvy.  -Encourage consistent medication adherence. -FU as scheduled in person in March - can increase to q3-4 month viral loads to if they desire.  Sexual Health Unprotected sex with partner. Discussed the risk of HIV transmission, which is zero given the patient's undetectable status. Also discussed the possibility of pregnancy and the need for birth control if ongoing condomless sex is planned. She does not want pregnancy at this time and will consider hormonal birth control options. Can refer to GYN for non-pill options if she desires as well.  -Consider birth control options if not planning for pregnancy. -Encourage condom use for protection against other STDs and to have both come in for formal testing if desired.   General Health Maintenance Partner's HIV status unknown. Discussed the option of partner taking PrEP for additional peace of mind, although not necessary given the patient's undetectable status. -Encourage partner to get tested for HIV. -Consider PrEP for partner if desired for additional peace of mind.       Katherine Alberts, MSN, NP-C [facility] for Infectious Disease [facility]  [name] Pager: [phone] Office: [phone] [facility] Main Line: [phone] *Secure Chat Communication Welcome

## 2023-07-04 ENCOUNTER — Ambulatory Visit: Payer: Self-pay | Admitting: Infectious Diseases

## 2023-07-09 ENCOUNTER — Ambulatory Visit: Payer: Self-pay | Admitting: Infectious Diseases

## 2023-07-09 ENCOUNTER — Encounter: Payer: Self-pay | Admitting: Infectious Diseases

## 2023-07-09 ENCOUNTER — Other Ambulatory Visit: Payer: Self-pay

## 2023-07-09 VITALS — BP 123/80 | HR 79 | Temp 98.1°F | Wt 175.2 lb

## 2023-07-09 DIAGNOSIS — B20 Human immunodeficiency virus [HIV] disease: Secondary | ICD-10-CM

## 2023-07-09 DIAGNOSIS — Z0000 Encounter for general adult medical examination without abnormal findings: Secondary | ICD-10-CM

## 2023-07-09 DIAGNOSIS — Z21 Asymptomatic human immunodeficiency virus [HIV] infection status: Secondary | ICD-10-CM

## 2023-07-09 NOTE — Patient Instructions (Signed)
 Start a food journal where you document everything you're eating each meal and snacks. Include sodas and juices. This will help you see where your calories are coming from and track where changes can be made. Try to limit soda consumption to 2 days a week and only 1 on those days. This eliminates a lot of excess calories.   Start walking 20-30 minutes each day and increase as you're able. Daily movement is important  Remember to take your Symtuza/Tivicay on a full stomach so take with a meal. Make sure you're getting plenty of sleep - aim for 8 hours a night.   Follow up in 2-3 months for labs and to review changes discussed today.

## 2023-07-09 NOTE — Assessment & Plan Note (Signed)
 Discussed weight management tools - food journal, increasing exercise and sleep. BMI noted at 32.   Decrease soda consumption from 3 per day to only consuming two times a week and only 1 soda on those days. Track intake with food journal to determine where overconsumption occurs. Increase exercise to 20-30 minutes a day - start with walking. Instructed to make sustainable changes for longevity. Try to get 8 hours of sleep a night.   Follow up in 2-3 months for weight re-check and lab work.

## 2023-07-09 NOTE — Progress Notes (Signed)
 I was present during the evaluation and agree with the plan outlined below.   Weight Management = She is hopeful for help with weight loss. Brief diet recall and I think she is taking in a lot of extra sugar calories through soda and juices (4500 per week from our discussion). I would love to see her star there to decrease to 2 days a week of consumption and increasing walking. Discussed that lifestyle modification and nutrition counseling are necessary for success with any adjunctive weight loss medication.  She will come back in 43m to follow up.  Food diary suggested Discussed increasing fiber and protein with minimally processed foods.   HIV = well controlled on current regimen Tivicay + Symtuza - if achieves pregnancy will need to consider alternative  Flu shot today.   Sexual health - no needs at present.    Rexene Alberts, MSN, NP-C [facility] for Infectious Disease [facility]  [email or web address] Pager: [phone] Office: [phone] [facility] Main Line: [phone] *Secure Chat Communication Welcome

## 2023-07-09 NOTE — Progress Notes (Signed)
 Name: Katherine [name]  DOB: 24-Jan-1996 MRN: [id number] PCP: Patient, No Pcp Per    Subjective:   Chief Complaint  Patient presents with   Follow-up      HPI: Perinatally acquired HIV infection under perfect control with once daily Symtuza and Tivicay taken together. Has been with current partner almost 1 year. He is not interested in PrEP at this time. Reports condom use during sexual intercourse. No other forms of birth control noted.   Expressing concern about weight gain and interested in making lifestyle changes. Reports daily soda consumption - sometimes 3 per day; limited water consumption.      01/22/2023   10:29 AM  Depression screen PHQ 2/9  Decreased Interest 0  Down, Depressed, Hopeless 0  PHQ - 2 Score 0    Review of Systems  All other systems reviewed and are negative.    Past Medical History:  Diagnosis Date   HIV (human immunodeficiency virus infection) (HCC)    Tuberculosis     Outpatient Medications Prior to Visit  Medication Sig Dispense Refill   Darunavir-Cobicistat-Emtricitabine-Tenofovir Alafenamide (SYMTUZA) 800-150-200-10 MG TABS Take 1 tablet by mouth daily with breakfast. 30 tablet 11   dolutegravir (TIVICAY) 50 MG tablet TAKE 1 TABLET BY MOUTH EVERY DAY 30 tablet 11   No facility-administered medications prior to visit.     No Known Allergies  Social History   Tobacco Use   Smoking status: Never   Smokeless tobacco: Never  Substance Use Topics   Alcohol use: No    Alcohol/week: 0.0 standard drinks of alcohol   Drug use: Never    Family History  Adopted: Yes  Problem Relation Age of Onset   HIV/AIDS Mother     Social History   Substance and Sexual Activity  Sexual Activity Never   Birth control/protection: None   Comment: declined condoms     Objective:   Vitals:   07/09/23 1016  BP: 123/80  Pulse: 79  Temp: 98.1 F (36.7 C)  TempSrc: Oral  SpO2: 96%  Weight: 175 lb 3.2 oz (79.5 kg)   Body mass index  is 32.04 kg/m.   Physical Exam Vitals reviewed.  Constitutional:      Appearance: Normal appearance. She is not ill-appearing.  HENT:     Head: Normocephalic.     Nose: Nose normal.     Mouth/Throat:     Mouth: Mucous membranes are moist.     Pharynx: Oropharynx is clear.  Eyes:     General: No scleral icterus. Cardiovascular:     Rate and Rhythm: Normal rate and regular rhythm.  Pulmonary:     Effort: Pulmonary effort is normal.  Musculoskeletal:        General: Normal range of motion.  Neurological:     Mental Status: She is oriented to person, place, and time.  Psychiatric:        Mood and Affect: Mood normal.        Behavior: Behavior normal.        Thought Content: Thought content normal.     Comments: Engaged in care discussion       Lab Results Lab Results  Component Value Date   WBC 5.3 07/13/2022   HGB 12.1 07/13/2022   HCT 37.1 07/13/2022   MCV 85.5 07/13/2022   PLT 248 07/13/2022    Lab Results  Component Value Date   CREATININE 0.80 07/13/2022   BUN 7 07/13/2022   NA 137 07/13/2022   K 3.9 07/13/2022  CL 107 07/13/2022   CO2 23 07/13/2022    Lab Results  Component Value Date   ALT 11 07/13/2022   AST 14 07/13/2022   ALKPHOS 84 11/12/2017   BILITOT 0.5 07/13/2022    Lab Results  Component Value Date   CHOL 190 07/13/2022   HDL 44 (L) 07/13/2022   LDLCALC 121 (H) 07/13/2022   TRIG 133 07/13/2022   CHOLHDL 4.3 07/13/2022   HIV 1 RNA Quant (Copies/mL)  Date Value  01/22/2023 <20 (H)  07/13/2022 Not Detected  01/23/2022 32 (H)   CD4 T Cell Abs (/uL)  Date Value  01/22/2023 502  07/13/2022 390 (L)  01/23/2022 435     Assessment & Plan:   Problem List Items Addressed This Visit       Other   HIV infection (HCC) - Primary   Continue Symtuza/Tivicay daily with food. No issues obtaining or taking medications daily. Will get labs at next visit is 2-3 months. Sexually active with long term partner - reports condom use; no other  forms of birth control. Partner is not interested in PrEP at this time.       Health care maintenance   Discussed weight management tools - food journal, increasing exercise and sleep. BMI noted at 32.   Decrease soda consumption from 3 per day to only consuming two times a week and only 1 soda on those days. Track intake with food journal to determine where overconsumption occurs. Increase exercise to 20-30 minutes a day - start with walking. Instructed to make sustainable changes for longevity. Try to get 8 hours of sleep a night.   Follow up in 2-3 months for weight re-check and lab work.       Geronimo Boot, RN, MPH Family Nurse Practitioner Student [employer]    07/09/23  10:59 AM

## 2023-07-09 NOTE — Assessment & Plan Note (Addendum)
 Continue Symtuza/Tivicay daily with food. No issues obtaining or taking medications daily. Will get labs at next visit is 2-3 months. Sexually active with long term partner - reports condom use; no other forms of birth control. Partner is not interested in PrEP at this time.

## 2023-07-10 ENCOUNTER — Ambulatory Visit: Payer: Self-pay | Admitting: Infectious Diseases

## 2023-10-01 ENCOUNTER — Ambulatory Visit: Payer: Self-pay | Admitting: Infectious Diseases

## 2024-01-22 ENCOUNTER — Other Ambulatory Visit: Payer: Self-pay | Admitting: Infectious Diseases

## 2024-01-22 DIAGNOSIS — B20 Human immunodeficiency virus [HIV] disease: Secondary | ICD-10-CM

## 2024-02-04 ENCOUNTER — Telehealth: Payer: Self-pay

## 2024-02-04 NOTE — Telephone Encounter (Signed)
 Left patient a voice mail to call back to schedule an overdue appointment with [facility].

## 2024-02-18 ENCOUNTER — Other Ambulatory Visit: Payer: Self-pay | Admitting: Infectious Diseases

## 2024-02-18 DIAGNOSIS — B20 Human immunodeficiency virus [HIV] disease: Secondary | ICD-10-CM

## 2024-02-18 NOTE — Telephone Encounter (Signed)
 Can someone please schedule for follow up please

## 2024-03-18 ENCOUNTER — Other Ambulatory Visit: Payer: Self-pay | Admitting: Infectious Diseases

## 2024-03-18 DIAGNOSIS — B20 Human immunodeficiency virus [HIV] disease: Secondary | ICD-10-CM

## 2024-03-18 NOTE — Telephone Encounter (Signed)
 Hey can someone reach out to patient to get her scheduled?

## 2024-03-19 ENCOUNTER — Other Ambulatory Visit: Payer: Self-pay

## 2024-04-07 ENCOUNTER — Other Ambulatory Visit: Payer: Self-pay

## 2024-04-07 ENCOUNTER — Ambulatory Visit: Payer: Self-pay | Admitting: Infectious Diseases

## 2024-04-07 ENCOUNTER — Encounter: Payer: Self-pay | Admitting: Infectious Diseases

## 2024-04-07 VITALS — BP 108/75 | HR 67 | Temp 97.6°F | Ht 62.0 in | Wt 166.0 lb

## 2024-04-07 DIAGNOSIS — Z0000 Encounter for general adult medical examination without abnormal findings: Secondary | ICD-10-CM

## 2024-04-07 DIAGNOSIS — Z79899 Other long term (current) drug therapy: Secondary | ICD-10-CM

## 2024-04-07 DIAGNOSIS — B20 Human immunodeficiency virus [HIV] disease: Secondary | ICD-10-CM

## 2024-04-07 DIAGNOSIS — Z23 Encounter for immunization: Secondary | ICD-10-CM

## 2024-04-07 MED ORDER — TIVICAY 50 MG PO TABS: 50.0000 mg | ORAL_TABLET | Freq: Every day | ORAL | 11 refills | Status: AC

## 2024-04-07 MED ORDER — SYMTUZA 800-150-200-10 MG PO TABS: 1.0000 | ORAL_TABLET | Freq: Every day | ORAL | 11 refills | Status: AC

## 2024-04-07 NOTE — Progress Notes (Signed)
 Patient: Katherine [name]  DOB: 1995/08/23 MRN: [id number] PCP: Patient, No Pcp Per  Referring Provider:   Reason for Visit: Follow up on ART management  Chief Complaint  Patient presents with   Follow-up      Subjective   Subjective:   Katherine [name] is a 28 year old female presenting to the clinic for her 61-month follow up. Since her last visit on 07/09/2023, she reports doing well and reports no recent hospitalizations or ER visits. She reports no new medications at this time. Her current regimen includes daily Symtuza  and Tivicay . She reports daily adherence with no adverse effects. She also reports no barriers in obtaining medication.She reports being sexually active with long term female partners. She reports using condoms. She denies vaginal discharge/odor, dysuria, itching/soreness, lesions/rashes, pelvic/abdominal pain, and bleeding between periods or after intercourse. She reports no tobacco, alcohol, or substance use. She reports being a little stress due to work but overall feeling really well. She reports recently receiving a phone call from [facility] to renew her ADAP. She denies fever, chills, night sweats, chest pain, and shortness of breath. She has no concerns at this time.   Review of Systems  Constitutional: Negative.   Respiratory: Negative.    Cardiovascular: Negative.   Gastrointestinal: Negative.   Genitourinary: Negative.   Musculoskeletal: Negative.   Skin: Negative.   Neurological: Negative.   Psychiatric/Behavioral: Negative.      Past Medical History:  Diagnosis Date   HIV (human immunodeficiency virus infection) (HCC)    Tuberculosis     Outpatient Medications Prior to Visit  Medication Sig Dispense Refill   SYMTUZA  800-150-200-10 MG TABS TAKE 1 TABLET BY MOUTH DAILY WITH BREAKFAST 30 tablet 0   TIVICAY  50 MG tablet TAKE 1 TABLET BY MOUTH EVERY DAY 30 tablet 0   No facility-administered medications prior to visit.     No  Known Allergies  Social History   Tobacco Use   Smoking status: Never   Smokeless tobacco: Never  Substance Use Topics   Alcohol use: No    Alcohol/week: 0.0 standard drinks of alcohol   Drug use: Never    Family History  Adopted: Yes  Problem Relation Age of Onset   HIV/AIDS Mother        Objective   Objective:   Vitals:   04/07/24 0828  BP: 108/75  Pulse: 67  Temp: 97.6 F (36.4 C)  TempSrc: Temporal  SpO2: 99%  Weight: 166 lb (75.3 kg)  Height: 5' 2 (1.575 m)   Body mass index is 30.36 kg/m.  Physical Exam Constitutional:      Appearance: Normal appearance.  Cardiovascular:     Rate and Rhythm: Normal rate and regular rhythm.  Pulmonary:     Effort: Pulmonary effort is normal.     Breath sounds: Normal breath sounds.  Musculoskeletal:        General: Normal range of motion.  Neurological:     Mental Status: She is alert and oriented to person, place, and time.  Psychiatric:        Mood and Affect: Mood normal.        Behavior: Behavior normal.        Thought Content: Thought content normal.        Judgment: Judgment normal.       Assessment & Plan:   Problem List Items Addressed This Visit       Other   Health care maintenance   Other Visit Diagnoses  HIV disease (HCC)    -  Primary   Relevant Medications   dolutegravir  (TIVICAY ) 50 MG tablet   Darunavir -Cobicistat -Emtricitabine -Tenofovir  Alafenamide (SYMTUZA ) 800-150-200-10 MG TABS   Other Relevant Orders   HIV 1 RNA quant-no reflex-bld   T-helper cells (CD4) count   Comp Met (CMET)   Hemoglobin A1c   CBC w/Diff     Need for influenza vaccination       Relevant Orders   Flu vaccine trivalent PF, 6mos and older(Flulaval,Afluria,Fluarix,Fluzone) (Completed)       Assessment and Plan  -HIV disease Current regiment include Symtuza  and Tivicay . She reports daily adherence, no missed doses, or adverse effects. She recently received a call for [facility] regarding her ADAP  renewal. Will have her talk to financial assistance. Last viral load <20 and CD4 502. Will refill medication today and order CBC, CMP, HIV-1 RNA, and CD4.   -Healthcare maintenance  She reports being sexually active with one female partners for which she uses condoms. She denies vaginal discharge/odor, dysuria, itching/soreness, lesions/rashes, pelvic/abdominal pain, and bleeding between periods or after intercourse. She declines STI screening at this time. Preventive care reviewed and states that she got her pap earlier this year. Dental visit up to date. Immunizations reviewed offered flu, Tdap, and pneumococcal vaccines. Will give flu today and she will consider Tdap and pneumococcal during this visit.     Orders Placed This Encounter  Procedures   Flu vaccine trivalent PF, 6mos and older(Flulaval,Afluria,Fluarix,Fluzone)   HIV 1 RNA quant-no reflex-bld   T-helper cells (CD4) count   Comp Met (CMET)   Hemoglobin A1c   CBC w/Diff    Meds ordered this encounter  Medications   dolutegravir  (TIVICAY ) 50 MG tablet    Sig: Take 1 tablet (50 mg total) by mouth daily.    Dispense:  30 tablet    Refill:  11    FILL WITH SYMTUZA     Prescription Type::   Renewal   Darunavir -Cobicistat -Emtricitabine -Tenofovir  Alafenamide (SYMTUZA ) 800-150-200-10 MG TABS    Sig: Take 1 tablet by mouth daily with breakfast.    Dispense:  30 tablet    Refill:  11    FILL WITH TIVICAY     Prescription Type::   Renewal    Return in about 6 months (around 10/06/2024).

## 2024-04-07 NOTE — Patient Instructions (Signed)
  Plan for a 6 month follow up with a pap smear next time.

## 2024-04-07 NOTE — Progress Notes (Signed)
 I saw the patient independently of the student NP and conducted my own interview / exam.  I agree with the documentation provided with the following additions:     Discussed the use of AI scribe software for clinical note transcription with the patient, who gave verbal consent to proceed.  History of Present Illness   Katherine [name] is a 28 year old female with HIV who presents for routine follow-up care.  She has been stable on her current antiretroviral regimen, which includes once daily Tivicay  and Symtuza . Her last office visit was in March, and her viral load in September 2024 was undetectable with a CD4 count of 502. No labs were done during her video visit in March.  There have been no changes in her medication regimen, and she confirms receiving her medications without issues. A recent pharmacy call regarding her prescription was resolved with a new prescription. She has experienced a weight loss of ten pounds since March, from 175 pounds to 165 pounds.  Regarding reproductive health, she is using condoms for pregnancy prevention and is not interested in hormonal birth control. Her menstrual cycles are regular and easy to track, and she is not planning for pregnancy at this time.  In terms of vaccinations, she is due for a flu shot. She is also due for a pneumonia vaccine booster and a tetanus booster.      Review of Systems  Constitutional:  Negative for chills and fever.  HENT:  Negative for sore throat.        No dental problems  Respiratory:  Negative for cough.   Cardiovascular:  Negative for chest pain and leg swelling.  Gastrointestinal:  Negative for abdominal pain, diarrhea and vomiting.  Genitourinary:  Negative for dysuria and flank pain.  Musculoskeletal:  Negative for myalgias and neck pain.  Skin:  Negative for rash.  Neurological:  Negative for dizziness and headaches.  Psychiatric/Behavioral:  The patient is not nervous/anxious.     Physical  Exam Constitutional:      Appearance: Normal appearance. She is not ill-appearing.  HENT:     Mouth/Throat:     Mouth: Mucous membranes are moist.     Pharynx: Oropharynx is clear.  Eyes:     General: No scleral icterus. Cardiovascular:     Rate and Rhythm: Normal rate.  Pulmonary:     Effort: Pulmonary effort is normal.  Neurological:     Mental Status: She is oriented to person, place, and time.  Psychiatric:        Mood and Affect: Mood normal.        Thought Content: Thought content normal.     HIV 1 RNA Quant (Copies/mL)  Date Value  01/22/2023 <20 (H)  07/13/2022 Not Detected  01/23/2022 32 (H)   CD4 T Cell Abs (/uL)  Date Value  01/22/2023 502  07/13/2022 390 (L)  01/23/2022 435   Lab Results  Component Value Date   CREATININE 0.80 07/13/2022   Lab Results  Component Value Date   ALT 11 07/13/2022   AST 14 07/13/2022   ALKPHOS 84 11/12/2017   BILITOT 0.5 07/13/2022   Assessment and Plan    Human immunodeficiency virus (HIV) disease - HIV is well-controlled with current antiretroviral therapy. Viral load was undetectable in September 2024, and CD4 count was 502. [place] reports stress related to work environment and feeling overwhelmed. Continues on once daily Tivicay  and Symtuza  regimen. - Continue Tivicay  and Symtuza  regimen taken with food together once daily  -  Sent in new prescription refills for the next year.  Routine health maintenance and preventive care - Routine health maintenance discussed. Weight loss of 10 pounds since March noted. Stress related to work environment acknowledged. No current plans for pregnancy. Using condoms for contraception. Pap smear due at next visit. - Administered flu shot today. - Ordered blood work to update health status. - Plan for Pap smear at next visit in six months. - Discussed tetanus booster and pneumonia vaccine; will consider at future visits. - Declines sexual health screenings today      Meds ordered  this encounter  Medications   dolutegravir  (TIVICAY ) 50 MG tablet    Sig: Take 1 tablet (50 mg total) by mouth daily.    Dispense:  30 tablet    Refill:  11    FILL WITH SYMTUZA     Prescription Type::   Renewal   Darunavir -Cobicistat -Emtricitabine -Tenofovir  Alafenamide (SYMTUZA ) 800-150-200-10 MG TABS    Sig: Take 1 tablet by mouth daily with breakfast.    Dispense:  30 tablet    Refill:  11    FILL WITH TIVICAY     Prescription Type::   Renewal   Orders Placed This Encounter  Procedures   HIV 1 RNA quant-no reflex-bld   T-helper cells (CD4) count   Comp Met (CMET)   Hemoglobin A1c   CBC w/Diff    Corean Fireman, MSN, NP-C [facility]  [email or web address] Pager: [phone] Office: [phone] [facility] Main Line: [phone] *Secure Chat Communication Welcome

## 2024-04-10 LAB — CBC WITH DIFFERENTIAL/PLATELET
Absolute Lymphocytes: 2089 {cells}/uL (ref 850–3900)
Absolute Monocytes: 549 {cells}/uL (ref 200–950)
Basophils Absolute: 18 {cells}/uL (ref 0–200)
Basophils Relative: 0.3 %
Eosinophils Absolute: 148 {cells}/uL (ref 15–500)
Eosinophils Relative: 2.5 %
HCT: 39.3 % (ref 35.9–46.0)
Hemoglobin: 12.6 g/dL (ref 11.7–15.5)
MCH: 28.3 pg (ref 27.0–33.0)
MCHC: 32.1 g/dL (ref 31.6–35.4)
MCV: 88.3 fL (ref 81.4–101.7)
MPV: 10.5 fL (ref 7.5–12.5)
Monocytes Relative: 9.3 %
Neutro Abs: 3098 {cells}/uL (ref 1500–7800)
Neutrophils Relative %: 52.5 %
Platelets: 255 Thousand/uL (ref 140–400)
RBC: 4.45 Million/uL (ref 3.80–5.10)
RDW: 11.8 % (ref 11.0–15.0)
Total Lymphocyte: 35.4 %
WBC: 5.9 Thousand/uL (ref 3.8–10.8)

## 2024-04-10 LAB — T-HELPER CELLS (CD4) COUNT (NOT AT ARMC)
Absolute CD4: 530 {cells}/uL (ref 490–1740)
CD4 T Helper %: 29 % — ABNORMAL LOW (ref 30–61)
Total lymphocyte count: 1839 {cells}/uL (ref 850–3900)

## 2024-04-10 LAB — COMPREHENSIVE METABOLIC PANEL WITH GFR
AG Ratio: 1.1 (calc) (ref 1.0–2.5)
ALT: 13 U/L (ref 6–29)
AST: 15 U/L (ref 10–30)
Albumin: 3.8 g/dL (ref 3.6–5.1)
Alkaline phosphatase (APISO): 86 U/L (ref 31–125)
BUN: 11 mg/dL (ref 7–25)
CO2: 24 mmol/L (ref 20–32)
Calcium: 9.3 mg/dL (ref 8.6–10.2)
Chloride: 107 mmol/L (ref 98–110)
Creat: 0.64 mg/dL (ref 0.50–0.96)
Globulin: 3.6 g/dL (ref 1.9–3.7)
Glucose, Bld: 71 mg/dL (ref 65–99)
Potassium: 4 mmol/L (ref 3.5–5.3)
Sodium: 140 mmol/L (ref 135–146)
Total Bilirubin: 0.2 mg/dL (ref 0.2–1.2)
Total Protein: 7.4 g/dL (ref 6.1–8.1)
eGFR: 123 mL/min/1.73m2 (ref >=60)

## 2024-04-10 LAB — HIV-1 RNA QUANT-NO REFLEX-BLD
HIV 1 RNA Quant: <20 {copies}/mL — AB
HIV-1 RNA Quant, Log: <1.3 {Log_copies}/mL — AB

## 2024-04-10 LAB — HEMOGLOBIN A1C
Hgb A1c MFr Bld: 5.4 % (ref <5.7)
Mean Plasma Glucose: 108 mg/dL
eAG (mmol/L): 6 mmol/L

## 2024-04-15 ENCOUNTER — Ambulatory Visit: Payer: Self-pay | Admitting: Infectious Diseases

## 2024-10-20 ENCOUNTER — Ambulatory Visit: Payer: Self-pay | Admitting: Infectious Diseases
